# Patient Record
Sex: Female | Born: 1990 | Race: White | Hispanic: No | Marital: Married | Smoking: Never smoker
Health system: Southern US, Community
[De-identification: ages and names within clinical notes are randomized; demographics above are authoritative.]

## PROBLEM LIST (undated history)

## (undated) DIAGNOSIS — F419 Anxiety disorder, unspecified: Secondary | ICD-10-CM

## (undated) DIAGNOSIS — I10 Essential (primary) hypertension: Secondary | ICD-10-CM

## (undated) HISTORY — PX: TONSILLECTOMY: SUR1361

---

## 2015-10-20 ENCOUNTER — Inpatient Hospital Stay (HOSPITAL_COMMUNITY): Payer: 59

## 2015-10-20 ENCOUNTER — Inpatient Hospital Stay (HOSPITAL_COMMUNITY)
Admission: AD | Admit: 2015-10-20 | Discharge: 2015-10-20 | Disposition: A | Discharge status: Home / Self Care | Payer: 59 | Source: Ambulatory Visit | Attending: Obstetrics & Gynecology | Admitting: Obstetrics & Gynecology

## 2015-10-20 ENCOUNTER — Encounter (HOSPITAL_COMMUNITY): Payer: Self-pay | Admitting: *Deleted

## 2015-10-20 DIAGNOSIS — R1030 Lower abdominal pain, unspecified: Secondary | ICD-10-CM | POA: Insufficient documentation

## 2015-10-20 DIAGNOSIS — O360110 Maternal care for anti-D [Rh] antibodies, first trimester, not applicable or unspecified: Secondary | ICD-10-CM | POA: Diagnosis not present

## 2015-10-20 DIAGNOSIS — O26892 Other specified pregnancy related conditions, second trimester: Secondary | ICD-10-CM | POA: Insufficient documentation

## 2015-10-20 DIAGNOSIS — O360920 Maternal care for other rhesus isoimmunization, second trimester, not applicable or unspecified: Secondary | ICD-10-CM | POA: Diagnosis not present

## 2015-10-20 DIAGNOSIS — O162 Unspecified maternal hypertension, second trimester: Secondary | ICD-10-CM | POA: Insufficient documentation

## 2015-10-20 DIAGNOSIS — Z79899 Other long term (current) drug therapy: Secondary | ICD-10-CM | POA: Insufficient documentation

## 2015-10-20 DIAGNOSIS — F419 Anxiety disorder, unspecified: Secondary | ICD-10-CM | POA: Diagnosis not present

## 2015-10-20 DIAGNOSIS — O4691 Antepartum hemorrhage, unspecified, first trimester: Secondary | ICD-10-CM | POA: Diagnosis not present

## 2015-10-20 DIAGNOSIS — O99342 Other mental disorders complicating pregnancy, second trimester: Secondary | ICD-10-CM | POA: Insufficient documentation

## 2015-10-20 DIAGNOSIS — O209 Hemorrhage in early pregnancy, unspecified: Secondary | ICD-10-CM | POA: Insufficient documentation

## 2015-10-20 HISTORY — DX: Anxiety disorder, unspecified: F41.9

## 2015-10-20 HISTORY — DX: Essential (primary) hypertension: I10

## 2015-10-20 LAB — URINALYSIS, ROUTINE W REFLEX MICROSCOPIC
BILIRUBIN URINE: NEGATIVE
Glucose, UA: NEGATIVE mg/dL
Ketones, ur: NEGATIVE mg/dL
Leukocytes, UA: NEGATIVE
NITRITE: NEGATIVE
PH: 6 (ref 5.0–8.0)
Protein, ur: NEGATIVE mg/dL

## 2015-10-20 LAB — URINE MICROSCOPIC-ADD ON

## 2015-10-20 LAB — CBC
HEMATOCRIT: 41.4 % (ref 36.0–46.0)
Hemoglobin: 14.1 g/dL (ref 12.0–15.0)
MCH: 30.3 pg (ref 26.0–34.0)
MCHC: 34.1 g/dL (ref 30.0–36.0)
MCV: 89 fL (ref 78.0–100.0)
Platelets: 291 10*3/uL (ref 150–400)
RBC: 4.65 MIL/uL (ref 3.87–5.11)
RDW: 13.5 % (ref 11.5–15.5)
WBC: 12 10*3/uL — AB (ref 4.0–10.5)

## 2015-10-20 LAB — POCT PREGNANCY, URINE: Preg Test, Ur: POSITIVE — AB

## 2015-10-20 LAB — ABO/RH: ABO/RH(D): O NEG

## 2015-10-20 LAB — HCG, QUANTITATIVE, PREGNANCY: hCG, Beta Chain, Quant, S: 2124 m[IU]/mL — ABNORMAL HIGH (ref <5)

## 2015-10-20 MED ORDER — RHO D IMMUNE GLOBULIN 1500 UNIT/2ML IJ SOSY
300.0000 ug | PREFILLED_SYRINGE | Freq: Once | INTRAMUSCULAR | Status: AC
Start: 2015-10-20 — End: 2015-10-20
  Administered 2015-10-20: 300 ug via INTRAMUSCULAR
  Filled 2015-10-20: qty 2

## 2015-10-20 NOTE — MAU Note (Signed)
Patient states she had sex last night and had some spotting afterwards.  Today the bleeding seemed worse and seemed more "like period bleeding."  Mild cramps today as well.

## 2015-10-20 NOTE — Discharge Instructions (Signed)
Rh Incompatibility Rh incompatibility is a condition that occurs during pregnancy if a woman has Rh-negative blood and her baby has Rh-positive blood. "Rh-negative" and "Rh-positive" refer to whether or not the blood has an Rh factor. An Rh factor is a specific protein found on the surface of red blood cells. If a woman has Rh factor, she is Rh-positive. If she does not have an Rh factor, she is Rh-negative. Having or not having an Rh factor does not affect the mother's general health. However, it can cause problems during pregnancy.  WHAT KIND OF PROBLEMS CAN Rh INCOMPATIBILITY CAUSE? During pregnancy, blood from the baby can cross into the mother's bloodstream, especially during delivery. If a mother is Rh-negative and the baby is Rh-positive, the mother's defense system will react to the baby's blood as if it was a foreign substance and will create proteins (antibodies). This is called sensitization. Once the mother is sensitized, her Rh antibodies will cross the placenta to the baby and attack the baby's Rh-positive blood as if it is a harmful substance.  Rh incompatibility can also happen if the Rh-negative pregnant woman is exposed to the Rh factor during a blood transfusion with Rh-positive blood.  HOW DOES THIS CONDITION AFFECT MY BABY? The Rh antibodies that attack and destroy the baby's red blood cells can lead to hemolytic disease in the baby. Hemolytic disease is when the red blood cells break down. This can cause:   Yellowing of the skin and eyes (jaundice).  The body to not have enough healthy red blood cells (anemia).   Brain damage.   Heart failure.   Death.  These antibodies usually do not cause problems during a first pregnancy. This is because the blood from the baby often times crosses into the mother's bloodstream during delivery, and the baby is born before many of the antibodies can develop. However, the antibodies stay in your body once they have formed. Because of this,  Rh incompatibility is more likely to cause problems in second or later pregnancies (if the baby is Rh-positive).  HOW IS THIS CONDITION DIAGNOSED? When a woman becomes pregnant, blood tests may be done to find out her blood type and Rh factor. If the woman is Rh-negative, she also may have another blood test called an antibody screen. The antibody screen shows whether she has Rh antibodies in her blood. If she does, it means she was exposed to Rh-positive blood before, and she is at risk for Rh incompatibility.  To find out whether the baby is developing hemolytic anemia and how serious it is, caregivers may use more advanced tests, such as ultrasonography (commonly known as ultrasound).  HOW IS Rh INCOMPATIBILITY TREATED?  Rh incompatibility is treated with a shot of medicine called Rho (D) immune globulin. This medicine keeps the woman's body from making antibodies that can cause serious problems in the baby or future babies.  Two shots will be given, one at around your seventh month of pregnancy and the other within 72 hours of your baby being born. If you are Rh-negative, you will need this medicine every time you have a baby with Rh-positive blood. If you already have antibodies in your blood, Rho (D) immune globulin will not help. Your doctor will not give you this medicine, but will watch your pregnancy closely for problems instead.  This shot may also be given to an Rh-negative woman when the risk of blood transfer between the mom and baby is high. The risk is high with:  An amniocentesis.   A miscarriage or an abortion.   An ectopic pregnancy.   Any vaginal bleeding during pregnancy.    This information is not intended to replace advice given to you by your health care provider. Make sure you discuss any questions you have with your health care provider.   Document Released: 08/24/2001 Document Revised: 03/09/2013 Document Reviewed: 06/16/2012 Elsevier Interactive Patient  Education 2016 Elsevier Inc.  Pelvic Rest Pelvic rest is sometimes recommended for women when:   The placenta is partially or completely covering the opening of the cervix (placenta previa).  There is bleeding between the uterine wall and the amniotic sac in the first trimester (subchorionic hemorrhage).  The cervix begins to open without labor starting (incompetent cervix, cervical insufficiency).  The labor is too early (preterm labor). HOME CARE INSTRUCTIONS  Do not have sexual intercourse, stimulation, or an orgasm.  Do not use tampons, douche, or put anything in the vagina.  Do not lift anything over 10 pounds (4.5 kg).  Avoid strenuous activity or straining your pelvic muscles. SEEK MEDICAL CARE IF:  You have any vaginal bleeding during pregnancy. Treat this as a potential emergency.  You have cramping pain felt low in the stomach (stronger than menstrual cramps).  You notice vaginal discharge (watery, mucus, or bloody).  You have a low, dull backache.  There are regular contractions or uterine tightening. SEEK IMMEDIATE MEDICAL CARE IF: You have vaginal bleeding and have placenta previa.    This information is not intended to replace advice given to you by your health care provider. Make sure you discuss any questions you have with your health care provider.   Document Released: 06/29/2010 Document Revised: 05/27/2011 Document Reviewed: 09/05/2014 Elsevier Interactive Patient Education [employer]

## 2015-10-20 NOTE — MAU Provider Note (Signed)
History     CSN: [id number]  Arrival date and time: 10/20/15 1335   First Provider Initiated Contact with Patient 10/20/15 1408      Chief Complaint  Patient presents with  . Vaginal Bleeding   HPI   Ms.Savannah Cabrera is a 25 y.o. female G1P0 here in MAU today with concerns of vaginal bleeding that started yesterday evening after intercourse. She used the bathroom and wiped and initially saw a small amount of blood. This morning she woke up and wiped and noted bright red blood which was more than the initial. The bleeding has since stopped.   + mild cramping in her lower abdomen. She rates her pain 1/10. She has not taken anything for the pain.   OB History    Gravida Para Term Preterm AB Living   1             SAB TAB Ectopic Multiple Live Births                  Past Medical History:  Diagnosis Date  . Anxiety   . Hypertension     Past Surgical History:  Procedure Laterality Date  . TONSILLECTOMY      History reviewed. No pertinent family history.  Social History  Substance Use Topics  . Smoking status: Never Smoker  . Smokeless tobacco: Never Used  . Alcohol use No    Allergies:  Allergies  Allergen Reactions  . Azithromycin Hives  . Cinnamon Hives    Itchy throat, headache    Prescriptions Prior to Admission  Medication Sig Dispense Refill Last Dose  . acetaminophen (TYLENOL) 500 MG tablet Take 1,000 mg by mouth daily as needed for headache.   Past Week at Unknown time  . Prenatal Vit-Fe Fumarate-FA (PRENATAL MULTIVITAMIN) TABS tablet Take 1 tablet by mouth daily.   10/20/2015 at Unknown time   Results for orders placed or performed during the hospital encounter of 10/20/15 (from the past 48 hour(s))  Urinalysis, Routine w reflex microscopic (not at [facility])     Status: Abnormal   Collection Time: 10/20/15  1:40 PM  Result Value Ref Range   Color, Urine YELLOW YELLOW   APPearance CLEAR CLEAR   Specific Gravity, Urine <1.005 (L) 1.005 - 1.030   pH 6.0  5.0 - 8.0   Glucose, UA NEGATIVE NEGATIVE mg/dL   Hgb urine dipstick LARGE (A) NEGATIVE   Bilirubin Urine NEGATIVE NEGATIVE   Ketones, ur NEGATIVE NEGATIVE mg/dL   Protein, ur NEGATIVE NEGATIVE mg/dL   Nitrite NEGATIVE NEGATIVE   Leukocytes, UA NEGATIVE NEGATIVE  Urine microscopic-add on     Status: Abnormal   Collection Time: 10/20/15  1:40 PM  Result Value Ref Range   Squamous Epithelial / LPF 0-5 (A) NONE SEEN   WBC, UA 0-5 0 - 5 WBC/hpf   RBC / HPF 0-5 0 - 5 RBC/hpf   Bacteria, UA RARE (A) NONE SEEN  Pregnancy, urine POC     Status: Abnormal   Collection Time: 10/20/15  1:46 PM  Result Value Ref Range   Preg Test, Ur POSITIVE (A) NEGATIVE    Comment:        THE SENSITIVITY OF THIS METHODOLOGY IS >24 mIU/mL   ABO/Rh     Status: None (Preliminary result)   Collection Time: 10/20/15  2:41 PM  Result Value Ref Range   ABO/RH(D) O NEG   CBC     Status: Abnormal   Collection Time: 10/20/15  2:42 PM  Result Value Ref Range   WBC 12.0 (H) 4.0 - 10.5 K/uL   RBC 4.65 3.87 - 5.11 MIL/uL   Hemoglobin 14.1 12.0 - 15.0 g/dL   HCT 40.9 81.1 - 91.4 %   MCV 89.0 78.0 - 100.0 fL   MCH 30.3 26.0 - 34.0 pg   MCHC 34.1 30.0 - 36.0 g/dL   RDW 78.2 95.6 - 21.3 %   Platelets 291 150 - 400 K/uL  hCG, quantitative, pregnancy     Status: Abnormal   Collection Time: 10/20/15  2:42 PM  Result Value Ref Range   hCG, Beta Chain, Quant, S 2,124 (H) <5 mIU/mL    Comment:          GEST. AGE      CONC.  (mIU/mL)   <=1 WEEK        5 - 50     2 WEEKS       50 - 500     3 WEEKS       100 - 10,000     4 WEEKS     1,000 - 30,000     5 WEEKS     3,500 - 115,000   6-8 WEEKS     12,000 - 270,000    12 WEEKS     15,000 - 220,000        FEMALE AND NON-PREGNANT FEMALE:     LESS THAN 5 mIU/mL     US Ob Comp Less 14 Wks  Result Date: 10/20/2015 CLINICAL DATA:  Bleeding.  Pregnant patient. EXAM: OBSTETRIC <14 WK [place] AND TRANSVAGINAL OB US TECHNIQUE: Both transabdominal and transvaginal ultrasound  examinations were performed for complete evaluation of the gestation as well as the maternal uterus, adnexal regions, and pelvic cul-de-sac. Transvaginal technique was performed to assess early pregnancy. COMPARISON:  None. FINDINGS: Intrauterine gestational sac: A single intrauterine gestational sac is identified. Yolk sac:  Present Embryo:  A single embryo is identified Cardiac Activity: Present Heart Rate: 129 beats per minute CRL:  6  mm   6 w   3 d                  [place] EDC: June 11, 2016 Subchorionic hemorrhage:  None visualized. Maternal uterus/adnexae: The ovaries are unremarkable. The left ovary was only seen transabdominally. IMPRESSION: 1. Single live IUP.  No cause for bleeding identified. Electronically Signed   By: Gerome Sam III M.D   On: 10/20/2015 15:30   US Ob Transvaginal  Result Date: 10/20/2015 CLINICAL DATA:  Bleeding.  Pregnant patient. EXAM: OBSTETRIC <14 WK [place] AND TRANSVAGINAL OB US TECHNIQUE: Both transabdominal and transvaginal ultrasound examinations were performed for complete evaluation of the gestation as well as the maternal uterus, adnexal regions, and pelvic cul-de-sac. Transvaginal technique was performed to assess early pregnancy. COMPARISON:  None. FINDINGS: Intrauterine gestational sac: A single intrauterine gestational sac is identified. Yolk sac:  Present Embryo:  A single embryo is identified Cardiac Activity: Present Heart Rate: 129 beats per minute CRL:  6  mm   6 w   3 d                  [place] EDC: June 11, 2016 Subchorionic hemorrhage:  None visualized. Maternal uterus/adnexae: The ovaries are unremarkable. The left ovary was only seen transabdominally. IMPRESSION: 1. Single live IUP.  No cause for bleeding identified. Electronically Signed   By: Gerome Sam III M.D   On: 10/20/2015 15:30    Review  of Systems  Constitutional: Negative for chills and fever.  Gastrointestinal: Positive for nausea.   Physical Exam   Blood pressure (!) 110/54, pulse 112,  temperature 98.2 F (36.8 C), temperature source Oral, resp. rate 18, last menstrual period 08/30/2015, SpO2 100 %.  Physical Exam  Constitutional: She is oriented to person, place, and time. She appears well-developed and well-nourished. No distress.  HENT:  Head: Normocephalic.  Eyes: Pupils are equal, round, and reactive to light.  Respiratory: Effort normal.  GI: Soft. She exhibits no distension. There is no tenderness. There is no rebound.  Genitourinary:  Genitourinary Comments: Speculum exam: Vagina - Small amount of dark red blood noted in the vaginal canal, several tiny clots noted,  no odor Cervix - No contact bleeding Bimanual exam: Cervix closed Uterus non tender, slightly enlarged  Adnexa non tender, no masses bilaterally Chaperone present for exam.  Musculoskeletal: Normal range of motion.  Neurological: She is alert and oriented to person, place, and time.  Skin: Skin is warm. She is not diaphoretic.  Psychiatric: Her behavior is normal.    MAU Course  Procedures  None  MDM  UA CBC  ABO HCG [place]  O negative blood type Rhogam given Discussed patient with Dr. Langston Masker.   Assessment and Plan   A:  1. Vaginal bleeding in pregnancy, first trimester   2. Rh negative state in antepartum period, first trimester, not applicable or unspecified fetus     P:  Discharge home in stable condition Pelvic rest Return to MAU if symptoms worsen [place] picture provided Rhogam given IM Follow up with OB as scheduled  Duane Lope, NP 10/20/2015 4:12 PM

## 2015-10-21 LAB — RH IG WORKUP (INCLUDES ABO/RH)
ABO/RH(D): O NEG
Antibody Screen: NEGATIVE
GESTATIONAL AGE(WKS): 7
Unit division: 0

## 2015-10-21 LAB — HIV ANTIBODY (ROUTINE TESTING W REFLEX): HIV SCREEN 4TH GENERATION: NONREACTIVE

## 2015-10-22 ENCOUNTER — Inpatient Hospital Stay (HOSPITAL_COMMUNITY)
Admission: AD | Admit: 2015-10-22 | Discharge: 2015-10-22 | Disposition: A | Discharge status: Home / Self Care | Payer: 59 | Source: Ambulatory Visit | Attending: Obstetrics & Gynecology | Admitting: Obstetrics & Gynecology

## 2015-10-22 ENCOUNTER — Encounter (HOSPITAL_COMMUNITY): Payer: Self-pay | Admitting: *Deleted

## 2015-10-22 ENCOUNTER — Inpatient Hospital Stay (HOSPITAL_COMMUNITY): Payer: 59

## 2015-10-22 DIAGNOSIS — N939 Abnormal uterine and vaginal bleeding, unspecified: Secondary | ICD-10-CM | POA: Diagnosis present

## 2015-10-22 DIAGNOSIS — O021 Missed abortion: Secondary | ICD-10-CM | POA: Insufficient documentation

## 2015-10-22 DIAGNOSIS — O034 Incomplete spontaneous abortion without complication: Secondary | ICD-10-CM

## 2015-10-22 MED ORDER — OXYCODONE-ACETAMINOPHEN 5-325 MG PO TABS: 2.0000 | ORAL_TABLET | ORAL | 0 refills | Status: DC | PRN

## 2015-10-22 MED ORDER — IBUPROFEN 600 MG PO TABS: 600.0000 mg | ORAL_TABLET | Freq: Four times a day (QID) | ORAL | 1 refills | Status: DC | PRN

## 2015-10-22 NOTE — MAU Provider Note (Signed)
History     CSN: [id number]  Arrival date and time: 10/22/15 1742   First Provider Initiated Contact with Patient 10/22/15 1804      Chief Complaint  Patient presents with  . Vaginal Bleeding   Savannah Cabrera is a 25 y.o. G1P0 at [redacted]w[redacted]d evaluated here 2 days ago for light vaginal bleeding and now presenting with increase in amount of bleeding similar to light menses with clots and questionable passage of gray tissue. She continues to have lower abdominal cramping. O neg, received Rhogam [place] 10/20/2015: viable IUP HR 129, no SCH Quantitative beta hCG 10/20/2015: 2124       Past Medical History:  Diagnosis Date  . Anxiety   . Hypertension     Past Surgical History:  Procedure Laterality Date  . TONSILLECTOMY      History reviewed. No pertinent family history.  Social History  Substance Use Topics  . Smoking status: Never Smoker  . Smokeless tobacco: Never Used  . Alcohol use No    Allergies:  Allergies  Allergen Reactions  . Azithromycin Hives  . Cinnamon Hives    Itchy throat, headache    Prescriptions Prior to Admission  Medication Sig Dispense Refill Last Dose  . acetaminophen (TYLENOL) 500 MG tablet Take 1,000 mg by mouth daily as needed for headache.   Past Week at Unknown time  . Prenatal Vit-Fe Fumarate-FA (PRENATAL MULTIVITAMIN) TABS tablet Take 1 tablet by mouth daily.   10/20/2015 at Unknown time    Review of Systems  Constitutional: Negative for chills and fever.  Neurological: Negative for headaches.  Psychiatric/Behavioral: Negative for depression.   Physical Exam   Blood pressure 116/77, pulse 113, temperature 98.3 F (36.8 C), temperature source Oral, resp. rate 16, last menstrual period 08/30/2015.  Physical Exam  Nursing note and vitals reviewed. Constitutional: She is oriented to person, place, and time.  Obese  HENT:  Head: Normocephalic.  Eyes: Pupils are equal, round, and reactive to light.  Neck: Normal range of motion.   Cardiovascular: Normal rate.   Respiratory: Effort normal.  GI: Soft. There is no tenderness.  Genitourinary:  Genitourinary Comments: Peri pad with small amount pink blood SVE: cx long, closed. Scant BRB present, no clots or tissue  Musculoskeletal: Normal range of motion.  Neurological: She is alert and oriented to person, place, and time.  Skin: Skin is warm and dry.  Psychiatric: She has a normal mood and affect. Her behavior is normal.    MAU Course  Procedures US Ob Transvaginal  Result Date: 10/22/2015 CLINICAL DATA:  Pregnant patient with vaginal bleeding. EXAM: TRANSVAGINAL OB ULTRASOUND TECHNIQUE: Transvaginal ultrasound was performed for complete evaluation of the gestation as well as the maternal uterus, adnexal regions, and pelvic cul-de-sac. COMPARISON:  Pelvic ultrasound 10/20/2015 FINDINGS: Intrauterine gestational sac: Single Yolk sac:  Present Embryo:  Present Cardiac Activity: Not present Heart Rate: 0 bpm CRL:   6.5  mm   6 w 4 d Subchorionic hemorrhage:  None visualized. Maternal uterus/adnexae: Normal right ovary. Left ovary not visualized. No free fluid in the pelvis. IMPRESSION: Intrauterine gestation with crown-rump length of 6.5 mm. No cardiac activity identified on current exam. Findings are suspicious but not yet definitive for failed pregnancy. Recommend follow-up US in 10-14 days for definitive diagnosis. This recommendation follows SRU consensus guidelines: Diagnostic Criteria for Nonviable Pregnancy Early in the First Trimester. Savannah Cabrera 2013; 409:8119-14. Electronically Signed   By: Savannah Belt M.D.   On: 10/22/2015 19:07  Discussed options with patient. Offered cytotec or expectant management. She elects to keep appointment with Savannah Cabrera in 2 days. C/W Savannah Cabrera> Patient to call office tomorrow morning to be seen tomorrow if possible    Assessment and Plan  G1 at 6033w5d IUP 1. Embryonic demise   2. Incomplete spontaneous abortion      Medication  List    TAKE these medications   acetaminophen 500 MG tablet Commonly known as:  TYLENOL Take 1,000 mg by mouth daily as needed for headache.   ibuprofen 600 MG tablet Commonly known as:  ADVIL,MOTRIN Take 1 tablet (600 mg total) by mouth every 6 (six) hours as needed (Use if ibuprofen ineffective).   oxyCODONE-acetaminophen 5-325 MG tablet Commonly known as:  PERCOCET/ROXICET Take 2 tablets by mouth every 4 (four) hours as needed for severe pain.   prenatal multivitamin Tabs tablet Take 1 tablet by mouth daily.      Follow-up Information    Savannah Cabrera,Savannah Cabrera, Savannah Cabrera. Schedule an appointment as soon as possible for a visit today.   Specialty:  Obstetrics and Gynecology Contact information: [address] [place] [id number] [phone]           Savannah Cabrera 10/22/2015, 6:18 PM

## 2015-10-22 NOTE — MAU Note (Signed)
Pt was here two days ago with vaginal bleeding following intercourse.  Pt was instructed to return if bleeding got worse or if she had new onset cramping.  The triage nurse also asked her if she had any gray-ish discharge and she said she had, so they instructed her to come in right away.  She is still bleeding, mostly only noticeable when she wipes but it is bright red and she has passed several small clots with new onset cramping that is intermittent ranging from a 2/10-5/10 on the pain scale. She has a [facility] scheduled for [facility].

## 2015-10-22 NOTE — Discharge Instructions (Signed)
Incomplete Miscarriage A miscarriage is the sudden loss of an unborn baby (fetus) before the 20th week of pregnancy. In an incomplete miscarriage, parts of the fetus or placenta (afterbirth) remain in the body.  Having a miscarriage can be an emotional experience. Talk with your health care provider about any questions you may have about miscarrying, the grieving process, and your future pregnancy plans. CAUSES   Problems with the fetal chromosomes that make it impossible for the baby to develop normally. Problems with the baby's genes or chromosomes are most often the result of errors that occur by chance as the embryo divides and grows. The problems are not inherited from the parents.  Infection of the cervix or uterus.  Hormone problems.  Problems with the cervix, such as having an incompetent cervix. This is when the tissue in the cervix is not strong enough to hold the pregnancy.  Problems with the uterus, such as an abnormally shaped uterus, uterine fibroids, or congenital abnormalities.  Certain medical conditions.  Smoking, drinking alcohol, or taking illegal drugs.  Trauma. SYMPTOMS   Vaginal bleeding or spotting, with or without cramps or pain.  Pain or cramping in the abdomen or lower back.  Passing fluid, tissue, or blood clots from the vagina. DIAGNOSIS  Your health care provider will perform a physical exam. You may also have an ultrasound to confirm the miscarriage. Blood or urine tests may also be ordered. TREATMENT   Usually, a dilation and curettage (D&C) procedure is performed. During a D&C procedure, the cervix is widened (dilated) and any remaining fetal or placental tissue is gently removed from the uterus.  Antibiotic medicines are prescribed if there is an infection. Other medicines may be given to reduce the size of the uterus (contract) if there is a lot of bleeding.  If you have Rh negative blood and your baby was Rh positive, you will need a Rho (D)  immune globulin shot. This shot will protect any future baby from having Rh blood problems in future pregnancies.  You may be confined to bed rest. This means you should stay in bed and only get up to use the bathroom. HOME CARE INSTRUCTIONS   Rest as directed by your health care provider.  Restrict activity as directed by your health care provider. You may be allowed to continue light activity if curettage was not done but you require further treatment.  Keep track of the number of pads you use each day. Keep track of how soaked (saturated) they are. Record this information.  Do not  use tampons.  Do not douche or have sexual intercourse until approved by your health care provider.  Keep all follow-up appointments for reevaluation and continuing management.  Only take over-the-counter or prescription medicines for pain, fever, or discomfort as directed by your health care provider.  Take antibiotic medicine as directed by your health care provider. Make sure you finish it even if you start to feel better. SEEK IMMEDIATE MEDICAL CARE IF:   You experience severe cramps in your stomach, back, or abdomen.  You have an unexplained temperature (make sure to record these temperatures).  You pass large clots or tissue (save these for your health care provider to inspect).  Your bleeding increases.  You become light-headed, weak, or have fainting episodes. MAKE SURE YOU:   Understand these instructions.  Will watch your condition.  Will get help right away if you are not doing well or get worse.   This information is not intended to   replace advice given to you by your health care provider. Make sure you discuss any questions you have with your health care provider.   Document Released: 03/04/2005 Document Revised: 03/25/2014 Document Reviewed: 10/01/2012 Elsevier Interactive Patient Education 2016 [employer] Inc.  

## 2016-08-20 LAB — OB RESULTS CONSOLE HIV ANTIBODY (ROUTINE TESTING): HIV: NONREACTIVE

## 2016-08-20 LAB — OB RESULTS CONSOLE GC/CHLAMYDIA
Chlamydia: NEGATIVE
Gonorrhea: NEGATIVE

## 2016-08-20 LAB — OB RESULTS CONSOLE RUBELLA ANTIBODY, IGM: Rubella: NON-IMMUNE/NOT IMMUNE

## 2016-08-20 LAB — OB RESULTS CONSOLE RPR: RPR: NONREACTIVE

## 2016-08-20 LAB — OB RESULTS CONSOLE HEPATITIS B SURFACE ANTIGEN: Hepatitis B Surface Ag: NEGATIVE

## 2016-08-24 ENCOUNTER — Encounter (HOSPITAL_COMMUNITY): Payer: Self-pay

## 2017-02-20 LAB — OB RESULTS CONSOLE GBS: STREP GROUP B AG: NEGATIVE

## 2017-03-19 ENCOUNTER — Encounter (HOSPITAL_COMMUNITY): Payer: Self-pay | Admitting: *Deleted

## 2017-03-19 ENCOUNTER — Inpatient Hospital Stay (HOSPITAL_COMMUNITY): Payer: 59 | Admitting: Anesthesiology

## 2017-03-19 ENCOUNTER — Inpatient Hospital Stay (HOSPITAL_COMMUNITY)
Admission: AD | Admit: 2017-03-19 | Discharge: 2017-03-21 | DRG: 788 | Disposition: A | Discharge status: Home / Self Care | Payer: 59 | Source: Ambulatory Visit | Attending: Obstetrics and Gynecology | Admitting: Obstetrics and Gynecology

## 2017-03-19 ENCOUNTER — Encounter (HOSPITAL_COMMUNITY)
Admission: AD | Disposition: A | Discharge status: Home / Self Care | Payer: Self-pay | Source: Ambulatory Visit | Attending: Obstetrics and Gynecology

## 2017-03-19 ENCOUNTER — Other Ambulatory Visit: Payer: Self-pay

## 2017-03-19 DIAGNOSIS — O99214 Obesity complicating childbirth: Secondary | ICD-10-CM | POA: Diagnosis present

## 2017-03-19 DIAGNOSIS — K219 Gastro-esophageal reflux disease without esophagitis: Secondary | ICD-10-CM | POA: Diagnosis present

## 2017-03-19 DIAGNOSIS — O9962 Diseases of the digestive system complicating childbirth: Secondary | ICD-10-CM | POA: Diagnosis present

## 2017-03-19 DIAGNOSIS — Z3483 Encounter for supervision of other normal pregnancy, third trimester: Secondary | ICD-10-CM | POA: Diagnosis present

## 2017-03-19 DIAGNOSIS — Z3A39 39 weeks gestation of pregnancy: Secondary | ICD-10-CM

## 2017-03-19 LAB — CBC
HCT: 43.1 % (ref 36.0–46.0)
Hemoglobin: 14.1 g/dL (ref 12.0–15.0)
MCH: 28.8 pg (ref 26.0–34.0)
MCHC: 32.7 g/dL (ref 30.0–36.0)
MCV: 88.1 fL (ref 78.0–100.0)
PLATELETS: 182 10*3/uL (ref 150–400)
RBC: 4.89 MIL/uL (ref 3.87–5.11)
RDW: 14.7 % (ref 11.5–15.5)
WBC: 12.3 10*3/uL — AB (ref 4.0–10.5)

## 2017-03-19 LAB — TYPE AND SCREEN
ABO/RH(D): O NEG
ANTIBODY SCREEN: NEGATIVE

## 2017-03-19 LAB — POCT FERN TEST: POCT FERN TEST: NEGATIVE

## 2017-03-19 SURGERY — Surgical Case
Anesthesia: Spinal

## 2017-03-19 MED ORDER — OXYTOCIN 40 UNITS IN LACTATED RINGERS INFUSION - SIMPLE MED: 2.5000 [IU]/h | INTRAVENOUS | Status: DC

## 2017-03-19 MED ORDER — MORPHINE SULFATE (PF) 0.5 MG/ML IJ SOLN
INTRAMUSCULAR | Status: AC
  Filled 2017-03-19: qty 10

## 2017-03-19 MED ORDER — DEXTROSE 5 % IV SOLN
INTRAVENOUS | Status: AC
  Filled 2017-03-19: qty 3000

## 2017-03-19 MED ORDER — MEPERIDINE HCL 25 MG/ML IJ SOLN
INTRAMUSCULAR | Status: AC
  Filled 2017-03-19: qty 1

## 2017-03-19 MED ORDER — DEXAMETHASONE SODIUM PHOSPHATE 10 MG/ML IJ SOLN
INTRAMUSCULAR | Status: AC
  Filled 2017-03-19: qty 1

## 2017-03-19 MED ORDER — LACTATED RINGERS IV SOLN
INTRAVENOUS | Status: DC
  Administered 2017-03-19 (×4): via INTRAVENOUS

## 2017-03-19 MED ORDER — MORPHINE SULFATE (PF) 0.5 MG/ML IJ SOLN
INTRAMUSCULAR | Status: DC | PRN
  Administered 2017-03-19: .2 mg via INTRATHECAL

## 2017-03-19 MED ORDER — FENTANYL CITRATE (PF) 100 MCG/2ML IJ SOLN
INTRAMUSCULAR | Status: AC
  Filled 2017-03-19: qty 2

## 2017-03-19 MED ORDER — DEXTROSE 5 % IV SOLN
3.0000 g | INTRAVENOUS | Status: DC
  Filled 2017-03-19: qty 3000

## 2017-03-19 MED ORDER — ONDANSETRON HCL 4 MG/2ML IJ SOLN
4.0000 mg | Freq: Four times a day (QID) | INTRAMUSCULAR | Status: DC | PRN
  Administered 2017-03-19: 4 mg via INTRAVENOUS
  Filled 2017-03-19: qty 2

## 2017-03-19 MED ORDER — OXYCODONE-ACETAMINOPHEN 5-325 MG PO TABS: 1.0000 | ORAL_TABLET | ORAL | Status: DC | PRN

## 2017-03-19 MED ORDER — TERBUTALINE SULFATE 1 MG/ML IJ SOLN: 0.2500 mg | Freq: Once | INTRAMUSCULAR | Status: DC | PRN

## 2017-03-19 MED ORDER — PHENYLEPHRINE HCL 10 MG/ML IJ SOLN
INTRAMUSCULAR | Status: DC | PRN
  Administered 2017-03-19 (×6): 80 ug via INTRAVENOUS

## 2017-03-19 MED ORDER — SOD CITRATE-CITRIC ACID 500-334 MG/5ML PO SOLN
30.0000 mL | ORAL | Status: DC | PRN
  Administered 2017-03-19 (×2): 30 mL via ORAL
  Filled 2017-03-19 (×2): qty 15

## 2017-03-19 MED ORDER — OXYCODONE-ACETAMINOPHEN 5-325 MG PO TABS: 2.0000 | ORAL_TABLET | ORAL | Status: DC | PRN

## 2017-03-19 MED ORDER — FLEET ENEMA 7-19 GM/118ML RE ENEM: 1.0000 | ENEMA | RECTAL | Status: DC | PRN

## 2017-03-19 MED ORDER — ONDANSETRON HCL 4 MG/2ML IJ SOLN
INTRAMUSCULAR | Status: DC | PRN
  Administered 2017-03-19: 4 mg via INTRAVENOUS

## 2017-03-19 MED ORDER — OXYTOCIN 40 UNITS IN LACTATED RINGERS INFUSION - SIMPLE MED
1.0000 m[IU]/min | INTRAVENOUS | Status: DC
  Administered 2017-03-19: 2 m[IU]/min via INTRAVENOUS
  Filled 2017-03-19: qty 1000

## 2017-03-19 MED ORDER — FENTANYL CITRATE (PF) 100 MCG/2ML IJ SOLN
INTRAMUSCULAR | Status: DC | PRN
  Administered 2017-03-19: 10 ug via INTRATHECAL

## 2017-03-19 MED ORDER — DEXTROSE 5 % IV SOLN
INTRAVENOUS | Status: DC | PRN
  Administered 2017-03-19: 3 g via INTRAVENOUS

## 2017-03-19 MED ORDER — SCOPOLAMINE 1 MG/3DAYS TD PT72
MEDICATED_PATCH | TRANSDERMAL | Status: DC | PRN
  Administered 2017-03-19: 1 via TRANSDERMAL

## 2017-03-19 MED ORDER — MEPERIDINE HCL 25 MG/ML IJ SOLN
INTRAMUSCULAR | Status: DC | PRN
  Administered 2017-03-19 (×2): 12.5 mg via INTRAVENOUS

## 2017-03-19 MED ORDER — ACETAMINOPHEN 325 MG PO TABS: 650.0000 mg | ORAL_TABLET | ORAL | Status: DC | PRN

## 2017-03-19 MED ORDER — OXYTOCIN BOLUS FROM INFUSION: 500.0000 mL | Freq: Once | INTRAVENOUS | Status: DC

## 2017-03-19 MED ORDER — FENTANYL CITRATE (PF) 100 MCG/2ML IJ SOLN
25.0000 ug | INTRAMUSCULAR | Status: DC | PRN
  Administered 2017-03-19: 50 ug via INTRAVENOUS

## 2017-03-19 MED ORDER — SCOPOLAMINE 1 MG/3DAYS TD PT72
MEDICATED_PATCH | TRANSDERMAL | Status: AC
  Filled 2017-03-19: qty 1

## 2017-03-19 MED ORDER — LACTATED RINGERS IV SOLN: 500.0000 mL | INTRAVENOUS | Status: DC | PRN

## 2017-03-19 MED ORDER — DEXAMETHASONE SODIUM PHOSPHATE 10 MG/ML IJ SOLN
INTRAMUSCULAR | Status: DC | PRN
  Administered 2017-03-19: 10 mg via INTRAVENOUS

## 2017-03-19 MED ORDER — LIDOCAINE HCL (PF) 1 % IJ SOLN: 30.0000 mL | INTRAMUSCULAR | Status: DC | PRN

## 2017-03-19 MED ORDER — PHENYLEPHRINE 8 MG IN D5W 100 ML (0.08MG/ML) PREMIX OPTIME
INJECTION | INTRAVENOUS | Status: DC | PRN
  Administered 2017-03-19: 40 ug/min via INTRAVENOUS

## 2017-03-19 MED ORDER — MEPERIDINE HCL 25 MG/ML IJ SOLN: 6.2500 mg | INTRAMUSCULAR | Status: DC | PRN

## 2017-03-19 MED ORDER — ONDANSETRON HCL 4 MG/2ML IJ SOLN
INTRAMUSCULAR | Status: AC
  Filled 2017-03-19: qty 2

## 2017-03-19 MED ORDER — PROMETHAZINE HCL 25 MG/ML IJ SOLN
6.2500 mg | INTRAMUSCULAR | Status: DC | PRN
  Administered 2017-03-20: 12.5 mg via INTRAVENOUS

## 2017-03-19 MED ORDER — OXYTOCIN 10 UNIT/ML IJ SOLN
INTRAVENOUS | Status: DC | PRN
  Administered 2017-03-19: 40 [IU] via INTRAVENOUS

## 2017-03-19 MED ORDER — OXYTOCIN 10 UNIT/ML IJ SOLN
INTRAMUSCULAR | Status: AC
  Filled 2017-03-19: qty 4

## 2017-03-19 MED ORDER — SOD CITRATE-CITRIC ACID 500-334 MG/5ML PO SOLN: 30.0000 mL | ORAL | Status: DC

## 2017-03-19 MED ORDER — BUPIVACAINE IN DEXTROSE 0.75-8.25 % IT SOLN
INTRATHECAL | Status: DC | PRN
  Administered 2017-03-19: 1.6 mL via INTRATHECAL

## 2017-03-19 SURGICAL SUPPLY — 41 items
BENZOIN TINCTURE PRP APPL 2/3 (GAUZE/BANDAGES/DRESSINGS) ×3 IMPLANT
CHLORAPREP W/TINT 26ML (MISCELLANEOUS) ×3 IMPLANT
CLAMP CORD UMBIL (MISCELLANEOUS) IMPLANT
CLOSURE STERI STRIP 1/2 X4 (GAUZE/BANDAGES/DRESSINGS) ×3 IMPLANT
CLOTH BEACON ORANGE TIMEOUT ST (SAFETY) ×3 IMPLANT
DERMABOND ADVANCED (GAUZE/BANDAGES/DRESSINGS) ×2
DERMABOND ADVANCED .7 DNX12 (GAUZE/BANDAGES/DRESSINGS) ×1 IMPLANT
DRSG OPSITE POSTOP 4X10 (GAUZE/BANDAGES/DRESSINGS) ×6 IMPLANT
ELECT REM PT RETURN 9FT ADLT (ELECTROSURGICAL) ×3
ELECTRODE REM PT RTRN 9FT ADLT (ELECTROSURGICAL) ×1 IMPLANT
EXTRACTOR VACUUM KIWI (MISCELLANEOUS) ×3 IMPLANT
GLOVE BIO SURGEON STRL SZ 6.5 (GLOVE) ×2 IMPLANT
GLOVE BIO SURGEONS STRL SZ 6.5 (GLOVE) ×1
GLOVE BIOGEL PI IND STRL 6.5 (GLOVE) ×1 IMPLANT
GLOVE BIOGEL PI IND STRL 7.0 (GLOVE) ×2 IMPLANT
GLOVE BIOGEL PI INDICATOR 6.5 (GLOVE) ×2
GLOVE BIOGEL PI INDICATOR 7.0 (GLOVE) ×4
GOWN STRL REUS W/TWL LRG LVL3 (GOWN DISPOSABLE) ×6 IMPLANT
KIT ABG SYR 3ML LUER SLIP (SYRINGE) ×3 IMPLANT
NEEDLE HYPO 25X5/8 SAFETYGLIDE (NEEDLE) ×3 IMPLANT
NS IRRIG 1000ML POUR BTL (IV SOLUTION) ×3 IMPLANT
PACK C SECTION WH (CUSTOM PROCEDURE TRAY) ×3 IMPLANT
PAD ABD 7.5X8 STRL (GAUZE/BANDAGES/DRESSINGS) ×3 IMPLANT
PAD OB MATERNITY 4.3X12.25 (PERSONAL CARE ITEMS) ×3 IMPLANT
PENCIL SMOKE EVAC W/HOLSTER (ELECTROSURGICAL) ×3 IMPLANT
RETRACTOR WND ALEXIS 25 LRG (MISCELLANEOUS) IMPLANT
RTRCTR WOUND ALEXIS 25CM LRG (MISCELLANEOUS)
SPONGE GAUZE 4X4 12PLY STER LF (GAUZE/BANDAGES/DRESSINGS) ×6 IMPLANT
SPONGE LAP 18X18 X RAY DECT (DISPOSABLE) ×3 IMPLANT
STRIP CLOSURE SKIN 1/2X4 (GAUZE/BANDAGES/DRESSINGS) ×2 IMPLANT
SUT PLAIN 0 NONE (SUTURE) IMPLANT
SUT PLAIN 2 0 (SUTURE) ×2
SUT PLAIN ABS 2-0 CT1 27XMFL (SUTURE) ×1 IMPLANT
SUT VIC AB 0 CT1 36 (SUTURE) ×3 IMPLANT
SUT VIC AB 0 CTX 36 (SUTURE) ×4
SUT VIC AB 0 CTX36XBRD ANBCTRL (SUTURE) ×2 IMPLANT
SUT VIC AB 2-0 SH 27 (SUTURE) ×2
SUT VIC AB 2-0 SH 27XBRD (SUTURE) ×1 IMPLANT
SUT VIC AB 4-0 PS2 27 (SUTURE) ×3 IMPLANT
TOWEL OR 17X24 6PK STRL BLUE (TOWEL DISPOSABLE) ×3 IMPLANT
TRAY FOLEY BAG SILVER LF 14FR (SET/KITS/TRAYS/PACK) IMPLANT

## 2017-03-19 NOTE — Progress Notes (Signed)
SVE 6/90/-2 Recommended AROM while head is applied. Patient declines. Requesting IV be removed. Patient is vomiting and tachycardic and I recommended against removal.    Rosie FateE Sloan Takagi MD

## 2017-03-19 NOTE — Progress Notes (Signed)
SVE unchanged and contractions inadequate. D/w patient and husband now 3 hours w/out any cervical change. Recommend pitocin and they agree. Discussed we will attempt to titrate to ctxn adequacy.

## 2017-03-19 NOTE — Progress Notes (Signed)
Pt informed Dr. Elon SpannerLeger informed of her arrival & status.  POC is for pt to ambulate for 1 hour and cervix reeval upon completion of walking.  Pt instructed to ambulate, return in 1 hour or sooner if VB or ROM.  Pt verbalized understanding and agreeable.

## 2017-03-19 NOTE — MAU Note (Signed)
Urine in lab 

## 2017-03-19 NOTE — MAU Note (Signed)
Pt presents with c/o regular ctxs since 0330 this morning & LOF since 0430.  Denies VB.  Reports +FM.

## 2017-03-19 NOTE — Anesthesia Pain Management Evaluation Note (Signed)
  CRNA Pain Management Visit Note  Patient: Savannah Cabrera, 27 y.o., female  "Hello I am a member of the anesthesia team at [facility]. We have an anesthesia team available at all times to provide care throughout the hospital, including epidural management and anesthesia for C-section. I don't know your plan for the delivery whether it a natural birth, water birth, IV sedation, nitrous supplementation, doula or epidural, but we want to meet your pain goals."   1.Was your pain managed to your expectations on prior hospitalizations?   No prior hospitalizations  2.What is your expectation for pain management during this hospitalization?     Doula  3.How can we help you reach that goal? Be available if needed  Record the patient's initial score and the patient's pain goal.   Pain: 0  Pain Goal: 10 [facility] wants you to be able to say your pain was always managed very well.  Savannah Cabrera 03/19/2017

## 2017-03-19 NOTE — Transfer of Care (Signed)
Immediate Anesthesia Transfer of Care Note  Patient: Savannah Cabrera  Procedure(s) Performed: CESAREAN SECTION (N/A )  Patient Location: PACU  Anesthesia Type:Spinal  Level of Consciousness: awake, alert  and oriented  Airway & Oxygen Therapy: Patient Spontanous Breathing  Post-op Assessment: Report given to RN and Post -op Vital signs reviewed and stable  Post vital signs: Reviewed and stable  Last Vitals:  Vitals:   03/19/17 2051 03/19/17 2122  BP: (!) 143/80 (!) 139/97  Pulse: (!) 139 (!) 142  Resp: (!) 21   Temp: 36.9 C     Last Pain:  Vitals:   03/19/17 2051  TempSrc: Axillary  PainSc:          Complications: No apparent anesthesia complications

## 2017-03-19 NOTE — Op Note (Signed)
PROCEDURE DATE: 03/19/17  PREOPERATIVE DIAGNOSIS: Intrauterine pregnancy at 39.3 wga, Indication: arrest of dilation  POSTOPERATIVE DIAGNOSIS:The same  PROCEDURE: Primary Low TransverseCesarean Section  SURGEON: Dr. Belva AgeeElise Daphna Lafuente  INDICATIONS:This is a 26yo G2P0 at 39.3 wga requiring cesarean section secondary to arrest of dilation. Remained 8cm x 4 hours. Decision made to proceed with pLTCS.The risks of cesarean section discussed with the patient included but were not limited to: bleeding which may require transfusion or reoperation; infection which may require antibiotics; injury to bowel, bladder, ureters or other surrounding organs; injury to the fetus; need for additional procedures including hysterectomy in the event of a life-threatening hemorrhage; placental abnormalities wth subsequent pregnancies, incisional problems, thromboembolic phenomenon and other postoperative/anesthesia complications. The patient agreed with the proposed plan, giving informed consent for the procedure.   FINDINGS: Viable femaleinfant in vertex presentation,APGARs pending, Weight (prelim) 11 pounds, 2 ounces, Amniotic fluid clear, Intact placenta, three vessel cord. Grossly normal uterus, ovaries and fallopian tubes. .  ANESTHESIA: Epidural ESTIMATED BLOOD LOSS: 1056cc SPECIMENS: Placenta for pathology s/s LGA COMPLICATIONS: None immediate   PROCEDURE IN DETAIL: The patient received intravenous antibiotics (2g Ancef) and had sequential compression devices applied to her lower extremities while in the preoperative area. Shewasthen taken to the operating roomwhere epidural anesthesiawas dosed up to surgical level andwas found to be adequate. She was then placed in a dorsal supine position with a leftward tilt,and prepped and draped in a sterile manner.A foley catheter was placed into her bladder and attached to constant gravity. After an adequate timeout was performed,  aPfannenstiel skin incision was made with scalpel and carried through to the underlying layer of fascia. The fascia was incised in the midline and this incision was extended bilaterally using the Mayo scissors. Kocher clamps were applied to the superior aspect of the fascial incision and the underlying rectus muscles were dissected off bluntly. A similar process was carried out on the inferior aspect of the facial incision. The rectus muscles were separated in the midline bluntly and the peritoneum was entered bluntly. A bladder flap was created sharply and developed bluntly.Atransverse hysterotomy was made with a scalpel and extended bilaterally bluntly. The bladder blade was then removed. The infant was successfully delivered, and cord was clamped and cut and infant was handed over to awaiting neonatology team. Uterine massage was then administered and the placenta delivered intact with three-vessel cord. Cord gases were taken. The uterus was cleared of clot and debris. The hysterotomy was closed with 0 vicryl.A second imbricating suture of 0-vicryl was used to reinforce the incision and aid in hemostasis.The fascia was closed with 0-Vicryl in a running fashion with good restoration of anatomy. The subcutaneus tissue was irrigated and was reapproximated using three interrupted plain gut stitches. The skin was closed with 4-0 Vicryl in a subcuticular fashion.  Final EBL was 1056cc (all surgical site and was hemostatic at end of procedure) without any further bleeding on exam.   Pt tolerated the procedure well. All sponge/lap/needle counts were correct X 2. Pt taken to recovery room in stable condition.   Belva AgeeElise Keala Drum MD

## 2017-03-19 NOTE — Progress Notes (Signed)
AROM for clear fluid after pt consented. 8/c/-2 (but head well applied). OA Discuss high station and my concern for possible labor dystocia s/s large fetal vertex. Patient continues to desire expectant management.   Rosie FateE Naelani Lafrance MD

## 2017-03-19 NOTE — Progress Notes (Signed)
SVE completely unchanged.  IUPC and IFE placed and thus far contractions seem to be inadequate. I recommended labor augmentation and patient and husband decline pitocin. I voiced my concern regarding high station with being OA and 8cm - concern for impending labor dystocia. Patient and husband understand. CTM closely.    Rosie FateE Karey Suthers MD

## 2017-03-19 NOTE — Brief Op Note (Signed)
03/19/2017  11:02 PM  PATIENT:  Savannah Cabrera  27 y.o. female  PRE-OPERATIVE DIAGNOSIS:  Primary Cesarean section due to arrest of dilation  POST-OPERATIVE DIAGNOSIS:  Primary Cesarean section due to arrest of dilation  PROCEDURE:  Procedure(s): CESAREAN SECTION (N/A)  SURGEON:  Surgeon(s) and Role:    * Ranae PilaLeger, Deniz Eskridge Jennifer, MD - Primary  PHYSICIAN ASSISTANT:   ASSISTANTS: none   ANESTHESIA:   none  EBL:  1056 mL   BLOOD ADMINISTERED:none  DRAINS: Urinary Catheter (Foley)   LOCAL MEDICATIONS USED:  NONE  SPECIMEN:  Source of Specimen:  placenta  DISPOSITION OF SPECIMEN:  PATHOLOGY  COUNTS:  YES  TOURNIQUET:  * No tourniquets in log *  DICTATION: .Note written in EPIC  PLAN OF CARE: Admit to inpatient   PATIENT DISPOSITION:  PACU - hemodynamically stable.   Delay start of Pharmacological VTE agent (>24hrs) due to surgical blood loss or risk of bleeding: not applicable

## 2017-03-19 NOTE — Progress Notes (Signed)
Remains 8cm x >4 hours. SVE with very swollen cervix - so swollen that she feels more like 6cm than 8cm. Vertex remains -2. Contractions remain inadequate. Recommend proceed with primary CS for arrest of dilation. Risks discussed. All questions answered. Patient gives informed consent. 3g ancef on call to OR.     Rosie FateE Caydence Koenig MD

## 2017-03-19 NOTE — Anesthesia Procedure Notes (Signed)
Spinal  Patient location during procedure: OR Start time: 03/19/2017 9:41 PM End time: 03/19/2017 9:45 PM Staffing Anesthesiologist: Beryle LatheBrock, Juneau Doughman E, MD Performed: anesthesiologist  Preanesthetic Checklist Completed: patient identified, surgical consent, pre-op evaluation, timeout performed, IV checked, risks and benefits discussed and monitors and equipment checked Spinal Block Patient position: sitting Prep: DuraPrep Patient monitoring: heart rate, cardiac monitor, continuous pulse ox and blood pressure Approach: midline Location: L3-4 Injection technique: single-shot Needle Needle type: Pencan  Needle gauge: 24 G Additional Notes Functioning IV was confirmed and monitors were applied. Sterile prep and drape, including hand hygiene, mask, and sterile gloves were used. The patient was positioned and the spine was prepped. The skin was anesthetized with lidocaine. Free flow of clear CSF was obtained prior to injecting local anesthetic into the CSF. The spinal needle aspirated freely following injection. The needle was carefully withdrawn. The patient tolerated the procedure well. Consent was obtained prior to the procedure with all questions answered and concerns addressed. Risks including, but not limited to, bleeding, infection, nerve damage, paralysis, failed block, inadequate analgesia, allergic reaction, high spinal, itching, and headache were discussed and the patient wished to proceed.  Savannah Peerhomas Han Vejar, MD

## 2017-03-19 NOTE — Anesthesia Preprocedure Evaluation (Addendum)
Anesthesia Evaluation  Patient identified by MRN, date of birth, ID band Patient awake    Reviewed: Allergy & Precautions, NPO status , Patient's Chart, lab work & pertinent test results  Airway Mallampati: III  TM Distance: >3 FB Neck ROM: Full    Dental  (+) Dental Advisory Given, Teeth Intact, Implants,    Pulmonary neg pulmonary ROS,    Pulmonary exam normal breath sounds clear to auscultation       Cardiovascular Normal cardiovascular exam Rhythm:Regular Rate:Normal  Hx HTN in past, resolved per patient - no meds   Neuro/Psych Anxiety negative neurological ROS     GI/Hepatic Neg liver ROS, GERD  Controlled and Medicated,  Endo/Other  Morbid obesity  Renal/GU negative Renal ROS  negative genitourinary   Musculoskeletal negative musculoskeletal ROS (+)   Abdominal (+) + obese,   Peds  Hematology negative hematology ROS (+)   Anesthesia Other Findings   Reproductive/Obstetrics (+) Pregnancy                            Anesthesia Physical Anesthesia Plan  ASA: III  Anesthesia Plan: Spinal   Post-op Pain Management:    Induction:   PONV Risk Score and Plan: Treatment may vary due to age or medical condition  Airway Management Planned: Natural Airway and Nasal Cannula  Additional Equipment: None  Intra-op Plan:   Post-operative Plan:   Informed Consent: I have reviewed the patients History and Physical, chart, labs and discussed the procedure including the risks, benefits and alternatives for the proposed anesthesia with the patient or authorized representative who has indicated his/her understanding and acceptance.   Dental advisory given  Plan Discussed with: CRNA  Anesthesia Plan Comments:         Anesthesia Quick Evaluation

## 2017-03-19 NOTE — H&P (Signed)
Savannah Cabrera is a 27 y.o. female presenting for labor. OB History    Gravida Para Term Preterm AB Living   2             SAB TAB Ectopic Multiple Live Births                 Past Medical History:  Diagnosis Date  . Anxiety   . Hypertension    Past Surgical History:  Procedure Laterality Date  . TONSILLECTOMY     Family History: family history includes Diabetes in her paternal grandmother; Heart disease in her paternal grandfather; Hypertension in her father and paternal grandmother; Thyroid disease in her mother and sister. Social History:  reports that  has never smoked. she has never used smokeless tobacco. She reports that she does not drink alcohol or use drugs.     Maternal Diabetes: No Genetic Screening: Normal Maternal Ultrasounds/Referrals: Normal Fetal Ultrasounds or other Referrals:  None Maternal Substance Abuse:  No Significant Maternal Medications:  None Significant Maternal Lab Results:  None Other Comments:  None  ROS History Dilation: 4 Effacement (%): 80 Station: -2 Exam by:: F. Morris, RNC Blood pressure 138/76, pulse (!) 115, temperature 98.2 F (36.8 C), temperature source Oral, resp. rate 18, unknown if currently breastfeeding. Exam Physical Exam  Prenatal labs: ABO, Rh:   Antibody:   Rubella:   RPR:    HBsAg:    HIV:    GBS:     Assessment/Plan: 27 yo G2P0010 @ 39.3 wga presenting in likely labor. SVE 4/80/-2. Has declined cervical exams in the office and thus no baseline exam for comparision. Patient appears uncomf and in labor - contracting q 2-4 min. Thus, admit for suspected labor. Plan AROM when able and expectant manag't for now. Gbs neg.    Savannah Cabrera 03/19/2017, 11:29 AM

## 2017-03-20 LAB — RPR: RPR Ser Ql: NONREACTIVE

## 2017-03-20 MED ORDER — ACETAMINOPHEN 500 MG PO TABS
1000.0000 mg | ORAL_TABLET | Freq: Four times a day (QID) | ORAL | Status: AC
  Administered 2017-03-20 (×3): 1000 mg via ORAL
  Filled 2017-03-20 (×3): qty 2

## 2017-03-20 MED ORDER — KETOROLAC TROMETHAMINE 30 MG/ML IJ SOLN
INTRAMUSCULAR | Status: AC
  Administered 2017-03-20: 30 mg via INTRAVENOUS
  Filled 2017-03-20: qty 1

## 2017-03-20 MED ORDER — NALBUPHINE HCL 10 MG/ML IJ SOLN: 5.0000 mg | Freq: Once | INTRAMUSCULAR | Status: DC | PRN

## 2017-03-20 MED ORDER — OXYTOCIN 40 UNITS IN LACTATED RINGERS INFUSION - SIMPLE MED: 2.5000 [IU]/h | INTRAVENOUS | Status: AC

## 2017-03-20 MED ORDER — IBUPROFEN 600 MG PO TABS
600.0000 mg | ORAL_TABLET | Freq: Four times a day (QID) | ORAL | Status: DC
  Administered 2017-03-20 – 2017-03-21 (×5): 600 mg via ORAL
  Filled 2017-03-20 (×6): qty 1

## 2017-03-20 MED ORDER — SIMETHICONE 80 MG PO CHEW: 80.0000 mg | CHEWABLE_TABLET | ORAL | Status: DC | PRN

## 2017-03-20 MED ORDER — OXYCODONE HCL 5 MG PO TABS
5.0000 mg | ORAL_TABLET | ORAL | Status: DC | PRN
Start: 2017-03-20 — End: 2017-03-21
  Administered 2017-03-20 – 2017-03-21 (×3): 5 mg via ORAL
  Filled 2017-03-20 (×3): qty 1

## 2017-03-20 MED ORDER — DIPHENHYDRAMINE HCL 50 MG/ML IJ SOLN: 12.5000 mg | INTRAMUSCULAR | Status: DC | PRN

## 2017-03-20 MED ORDER — NALOXONE HCL 0.4 MG/ML IJ SOLN: 0.4000 mg | INTRAMUSCULAR | Status: DC | PRN

## 2017-03-20 MED ORDER — PRENATAL MULTIVITAMIN CH
1.0000 | ORAL_TABLET | Freq: Every day | ORAL | Status: DC
  Administered 2017-03-20 – 2017-03-21 (×2): 1 via ORAL
  Filled 2017-03-20 (×2): qty 1

## 2017-03-20 MED ORDER — KETOROLAC TROMETHAMINE 30 MG/ML IJ SOLN
30.0000 mg | Freq: Four times a day (QID) | INTRAMUSCULAR | Status: AC | PRN
  Administered 2017-03-20 (×2): 30 mg via INTRAVENOUS
  Filled 2017-03-20: qty 1

## 2017-03-20 MED ORDER — NALBUPHINE HCL 10 MG/ML IJ SOLN: 5.0000 mg | INTRAMUSCULAR | Status: DC | PRN

## 2017-03-20 MED ORDER — DIPHENHYDRAMINE HCL 25 MG PO CAPS: 25.0000 mg | ORAL_CAPSULE | ORAL | Status: DC | PRN

## 2017-03-20 MED ORDER — PROMETHAZINE HCL 25 MG/ML IJ SOLN
INTRAMUSCULAR | Status: AC
  Filled 2017-03-20: qty 1

## 2017-03-20 MED ORDER — SCOPOLAMINE 1 MG/3DAYS TD PT72
1.0000 | MEDICATED_PATCH | Freq: Once | TRANSDERMAL | Status: DC
  Filled 2017-03-20: qty 1

## 2017-03-20 MED ORDER — OXYCODONE HCL 5 MG PO TABS: 10.0000 mg | ORAL_TABLET | ORAL | Status: DC | PRN

## 2017-03-20 MED ORDER — DEXTROSE 5 % IV SOLN
1.0000 ug/kg/h | INTRAVENOUS | Status: DC | PRN
  Filled 2017-03-20: qty 5

## 2017-03-20 MED ORDER — MENTHOL 3 MG MT LOZG: 1.0000 | LOZENGE | OROMUCOSAL | Status: DC | PRN

## 2017-03-20 MED ORDER — ACETAMINOPHEN 325 MG PO TABS
650.0000 mg | ORAL_TABLET | ORAL | Status: DC | PRN
  Administered 2017-03-21: 650 mg via ORAL
  Filled 2017-03-20: qty 2

## 2017-03-20 MED ORDER — DIBUCAINE 1 % RE OINT: 1.0000 "application " | TOPICAL_OINTMENT | RECTAL | Status: DC | PRN

## 2017-03-20 MED ORDER — DIPHENHYDRAMINE HCL 25 MG PO CAPS: 25.0000 mg | ORAL_CAPSULE | Freq: Four times a day (QID) | ORAL | Status: DC | PRN

## 2017-03-20 MED ORDER — SODIUM CHLORIDE 0.9% FLUSH: 3.0000 mL | INTRAVENOUS | Status: DC | PRN

## 2017-03-20 MED ORDER — LACTATED RINGERS IV BOLUS (SEPSIS)
500.0000 mL | Freq: Once | INTRAVENOUS | Status: AC
  Administered 2017-03-20: 500 mL via INTRAVENOUS

## 2017-03-20 MED ORDER — LACTATED RINGERS IV SOLN
INTRAVENOUS | Status: DC
  Administered 2017-03-20: 07:00:00 via INTRAVENOUS

## 2017-03-20 MED ORDER — WITCH HAZEL-GLYCERIN EX PADS: 1.0000 "application " | MEDICATED_PAD | CUTANEOUS | Status: DC | PRN

## 2017-03-20 MED ORDER — SENNOSIDES-DOCUSATE SODIUM 8.6-50 MG PO TABS
2.0000 | ORAL_TABLET | ORAL | Status: DC
  Administered 2017-03-20: 2 via ORAL
  Filled 2017-03-20: qty 2

## 2017-03-20 MED ORDER — TETANUS-DIPHTH-ACELL PERTUSSIS 5-2.5-18.5 LF-MCG/0.5 IM SUSP: 0.5000 mL | Freq: Once | INTRAMUSCULAR | Status: DC

## 2017-03-20 MED ORDER — KETOROLAC TROMETHAMINE 30 MG/ML IJ SOLN: 30.0000 mg | Freq: Four times a day (QID) | INTRAMUSCULAR | Status: AC | PRN

## 2017-03-20 MED ORDER — RHO D IMMUNE GLOBULIN 1500 UNIT/2ML IJ SOSY
300.0000 ug | PREFILLED_SYRINGE | Freq: Once | INTRAMUSCULAR | Status: AC
  Administered 2017-03-20: 300 ug via INTRAVENOUS
  Filled 2017-03-20: qty 2

## 2017-03-20 MED ORDER — ZOLPIDEM TARTRATE 5 MG PO TABS: 5.0000 mg | ORAL_TABLET | Freq: Every evening | ORAL | Status: DC | PRN

## 2017-03-20 MED ORDER — ONDANSETRON HCL 4 MG/2ML IJ SOLN: 4.0000 mg | Freq: Three times a day (TID) | INTRAMUSCULAR | Status: DC | PRN

## 2017-03-20 MED ORDER — SIMETHICONE 80 MG PO CHEW
80.0000 mg | CHEWABLE_TABLET | ORAL | Status: DC
  Administered 2017-03-20: 80 mg via ORAL
  Filled 2017-03-20: qty 1

## 2017-03-20 MED ORDER — IBUPROFEN 600 MG PO TABS: 600.0000 mg | ORAL_TABLET | Freq: Four times a day (QID) | ORAL | Status: DC

## 2017-03-20 MED ORDER — COCONUT OIL OIL
1.0000 | TOPICAL_OIL | Status: DC | PRN
Start: 2017-03-20 — End: 2017-03-21

## 2017-03-20 MED ORDER — SIMETHICONE 80 MG PO CHEW
80.0000 mg | CHEWABLE_TABLET | Freq: Three times a day (TID) | ORAL | Status: DC
  Administered 2017-03-20 – 2017-03-21 (×5): 80 mg via ORAL
  Filled 2017-03-20 (×5): qty 1

## 2017-03-20 NOTE — Addendum Note (Signed)
Addendum  created 03/20/17 0808 by Shanon PayorGregory, Etty Isaac M, CRNA   Sign clinical note

## 2017-03-20 NOTE — Anesthesia Postprocedure Evaluation (Signed)
Anesthesia Post Note  Patient: Kyra Leylandmanda Pinon  Procedure(s) Performed: CESAREAN SECTION (N/A )     Patient location during evaluation: Mother Baby Anesthesia Type: Spinal Level of consciousness: awake and alert and oriented Pain management: satisfactory to patient Vital Signs Assessment: post-procedure vital signs reviewed and stable Respiratory status: spontaneous breathing and nonlabored ventilation Cardiovascular status: stable Postop Assessment: no headache, no backache, patient able to bend at knees, no signs of nausea or vomiting and adequate PO intake Anesthetic complications: no    Last Vitals:  Vitals:   03/20/17 0400 03/20/17 0509  BP: 117/64   Pulse: 99   Resp: 18 20  Temp: 36.8 C 36.7 C  SpO2: 97% 98%    Last Pain:  Vitals:   03/20/17 0648  TempSrc:   PainSc: 0-No pain   Pain Goal:                 Madison HickmanGREGORY,Mael Delap

## 2017-03-20 NOTE — Lactation Note (Signed)
This note was copied from a baby'Cabrera chart. Lactation Consultation Note  Patient Name: Savannah Kyra Leylandmanda Blackley WUJWJ'XToday'Cabrera Date: 03/20/2017 Reason for consult: Initial assessment;Primapara;Term Breastfeeding consultation services and support information given and reviewed.  This is mom'Cabrera first baby and newborn is 3112 hours old.  Mom reports newborn fed very well initially but has been spitty this morning.  Instructed to watch for feeding cues and call for assist prn.  Mom states she knows how to hand express and has seen colostrum.  Maternal Data Has patient been taught Hand Expression?: Yes Does the patient have breastfeeding experience prior to this delivery?: No  Feeding Feeding Type: Breast Fed Length of feed: 5 min  LATCH Score Latch: Repeated attempts needed to sustain latch, nipple held in mouth throughout feeding, stimulation needed to elicit sucking reflex.  Audible Swallowing: A few with stimulation  Type of Nipple: Everted at rest and after stimulation  Comfort (Breast/Nipple): Soft / non-tender  Hold (Positioning): Assistance needed to correctly position infant at breast and maintain latch.  LATCH Score: 7  Interventions    Lactation Tools Discussed/Used     Consult Status Consult Status: Follow-up Date: 03/21/17 Follow-up type: In-patient    Huston FoleyMOULDEN, Savannah Cabrera 03/20/2017, 10:32 AM

## 2017-03-20 NOTE — Anesthesia Postprocedure Evaluation (Signed)
Anesthesia Post Note  Patient: Savannah Cabrera  Procedure(s) Performed: CESAREAN SECTION (N/A )     Patient location during evaluation: PACU Anesthesia Type: Spinal Level of consciousness: oriented and awake and alert Pain management: pain level controlled Vital Signs Assessment: post-procedure vital signs reviewed and stable Respiratory status: spontaneous breathing, respiratory function stable and nonlabored ventilation Cardiovascular status: blood pressure returned to baseline and stable Postop Assessment: no headache, no backache, no apparent nausea or vomiting and spinal receding Anesthetic complications: no    Last Vitals:  Vitals:   03/20/17 0039 03/20/17 0057  BP:  135/71  Pulse: 100 (!) 114  Resp: (!) 33 20  Temp:  37.2 C  SpO2: 95% 98%    Last Pain:  Vitals:   03/20/17 0057  TempSrc: Oral  PainSc:    Pain Goal:                 Beryle Lathehomas E Brock

## 2017-03-20 NOTE — Progress Notes (Signed)
Subjective: Postpartum Day 1: Cesarean Delivery Patient reports tolerating PO.    Objective: Vital signs in last 24 hours: Temp:  [98 F (36.7 C)-99 F (37.2 C)] 98 F (36.7 C) (01/03 0509) Pulse Rate:  [99-159] 99 (01/03 0400) Resp:  [14-33] 20 (01/03 0509) BP: (93-152)/(33-99) 117/64 (01/03 0400) SpO2:  [95 %-100 %] 98 % (01/03 0509) Weight:  [319 lb (144.7 kg)-321 lb 11.2 oz (145.9 kg)] 321 lb 11.2 oz (145.9 kg) (01/02 1420)  Physical Exam:  General: alert, cooperative and no distress Lochia: appropriate Uterine Fundus: firm Incision: healing well DVT Evaluation: No evidence of DVT seen on physical exam.  Recent Labs    03/19/17 1142 03/20/17 0532  HGB 14.1 9.9*  HCT 43.1 30.5*    Assessment/Plan: Status post Cesarean section. Doing well postoperatively.  Continue current care.  Roselle LocusJames E Sacha Radloff II 03/20/2017, 8:25 AM

## 2017-03-20 NOTE — Progress Notes (Signed)

## 2017-03-20 NOTE — Progress Notes (Signed)
On assessment, pt output noted to be about 4625mls/hr.  Dr. Henderson Cloudomblin notified and ordered a 500ml LR bolus to be given over 1 hour.  Will continue to monitor.

## 2017-03-21 ENCOUNTER — Encounter (HOSPITAL_COMMUNITY): Payer: Self-pay | Admitting: *Deleted

## 2017-03-21 LAB — RH IG WORKUP (INCLUDES ABO/RH)
ABO/RH(D): O NEG
FETAL SCREEN: NEGATIVE
Gestational Age(Wks): 39.3
UNIT DIVISION: 0

## 2017-03-21 LAB — CBC
HEMATOCRIT: 30.5 % — AB (ref 36.0–46.0)
HEMOGLOBIN: 9.9 g/dL — AB (ref 12.0–15.0)
MCH: 29 pg (ref 26.0–34.0)
MCHC: 32.5 g/dL (ref 30.0–36.0)
MCV: 89.4 fL (ref 78.0–100.0)
Platelets: 209 10*3/uL (ref 150–400)
RBC: 3.41 MIL/uL — AB (ref 3.87–5.11)
RDW: 14.9 % (ref 11.5–15.5)
WBC: 27.4 10*3/uL — AB (ref 4.0–10.5)

## 2017-03-21 MED ORDER — IBUPROFEN 600 MG PO TABS: 600.0000 mg | ORAL_TABLET | Freq: Four times a day (QID) | ORAL | 0 refills | Status: DC

## 2017-03-21 MED ORDER — OXYCODONE-ACETAMINOPHEN 5-325 MG PO TABS: 1.0000 | ORAL_TABLET | ORAL | 0 refills | Status: DC | PRN

## 2017-03-21 NOTE — Progress Notes (Signed)
Patient took a shower and while taking pressure dressing off the honeycomb dressing lifted up. Notified Dr. Renaldo FiddlerAdkins. Verbal orders to apply a new Honeycomb dressing. Patient had a dizzy spell and saw spots at the end of her shower. She had been looking down attempting to take off dressing. Assisted patient out of shower and sat her on the toilet, patient felt better after sitting and drinking some juice. Able to ambulate to the chair in room on her own. Dr. Renaldo FiddlerAdkins notified of situation.

## 2017-03-21 NOTE — Discharge Summary (Signed)
Obstetric Discharge Summary Reason for Admission: onset of labor Prenatal Procedures: ultrasound Intrapartum Procedures: cesarean: low cervical, transverse Postpartum Procedures: none Complications-Operative and Postpartum: none Hemoglobin  Date Value Ref Range Status  03/20/2017 9.9 (L) 12.0 - 15.0 g/dL Final   HCT  Date Value Ref Range Status  03/20/2017 30.5 (L) 36.0 - 46.0 % Final    Physical Exam:  General: alert and cooperative Lochia: appropriate Uterine Fundus: firm Incision: no significant drainage DVT Evaluation: No evidence of DVT seen on physical exam.  Discharge Diagnoses: Term Pregnancy-delivered  Discharge Information: Date: 03/21/2017 Activity: pelvic rest Diet: routine Medications: PNV, Ibuprofen and Percocet Condition: stable Instructions: refer to practice specific booklet Discharge to: home Follow-up Information    [facility] Schedule an appointment as soon as possible for a visit in 1 week(s).   Contact information: [address] [place] [address] [phone]           Newborn Data: Live born female  Birth Weight: 11 lb 2.5 oz (5060 g) APGAR: 9, 9  Newborn Delivery   Birth date/time:  03/19/2017 22:15:00 Delivery type:  C-Section, Low Transverse C-section categorization:  Primary     Home with mother.  Savannah Cabrera 03/21/2017, 8:26 AM

## 2017-07-19 IMAGING — US US OB TRANSVAGINAL
1 series · 15 of 28 positions shown · non-contrast
Comparison: None.

CLINICAL DATA: Bleeding.  Pregnant patient.

EXAM:
OBSTETRIC <14 WK US AND TRANSVAGINAL OB US
TECHNIQUE: Both transabdominal and transvaginal ultrasound examinations were
performed for complete evaluation of the gestation as well as the
maternal uterus, adnexal regions, and pelvic cul-de-sac.
Transvaginal technique was performed to assess early pregnancy.

[Series 1: us ob transvaginal · 15 of 53 slices shown]
[im 1/53]
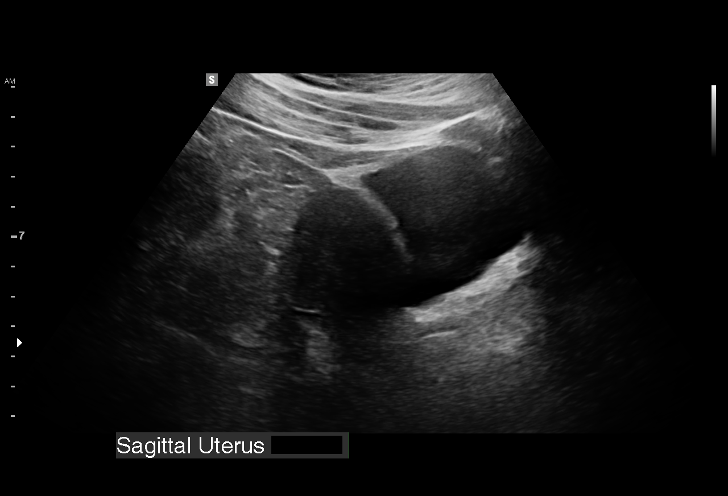
[im 4/53]
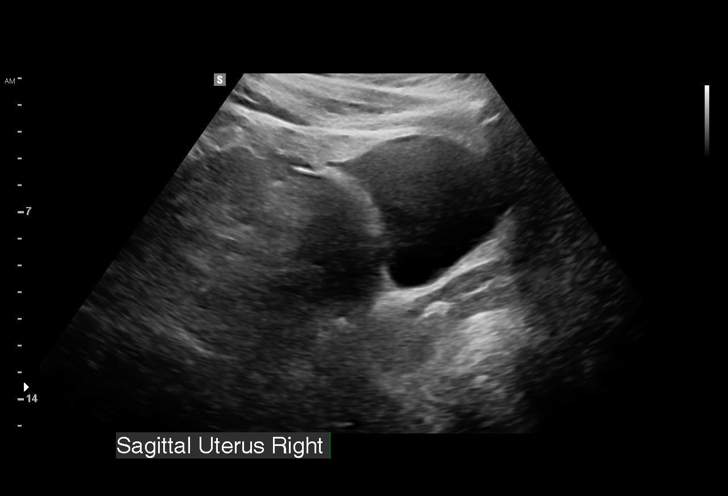
[im 8/53]
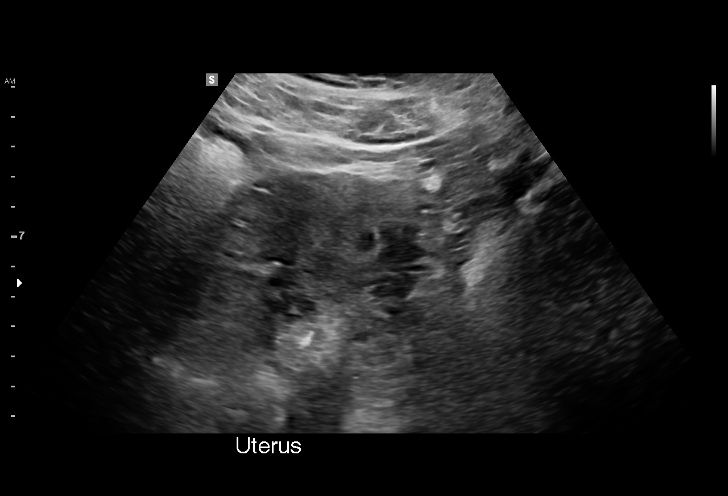
[im 12/53]
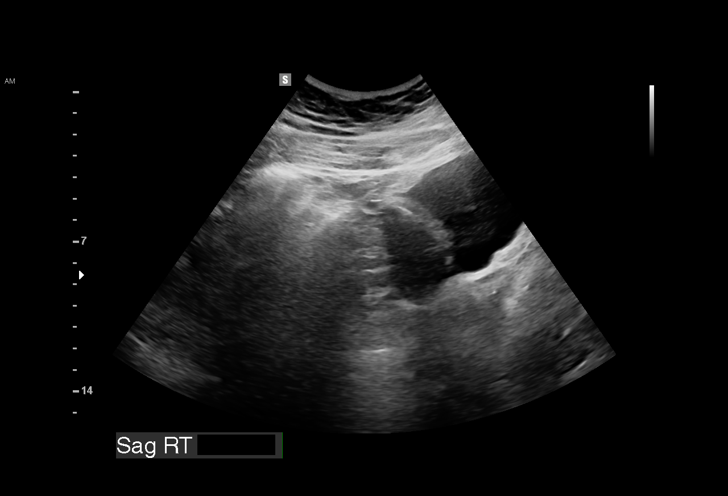
[im 16/53]
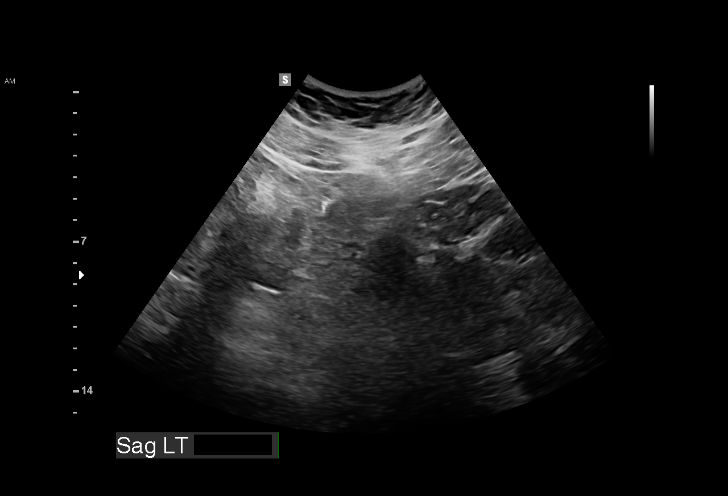
[im 20/53]
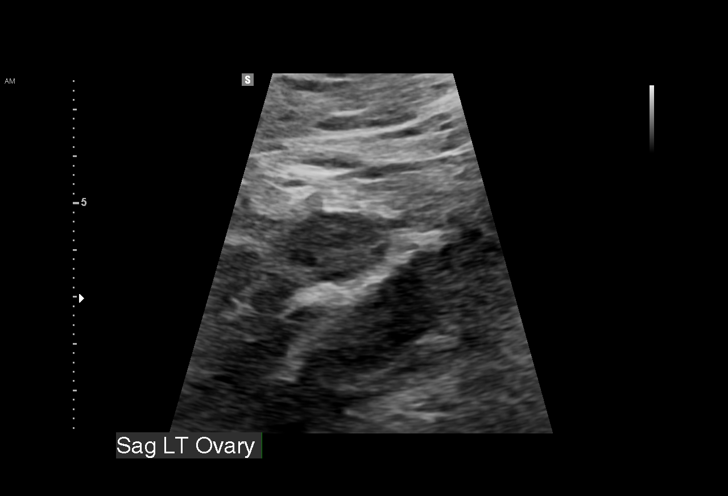
[im 24/53]
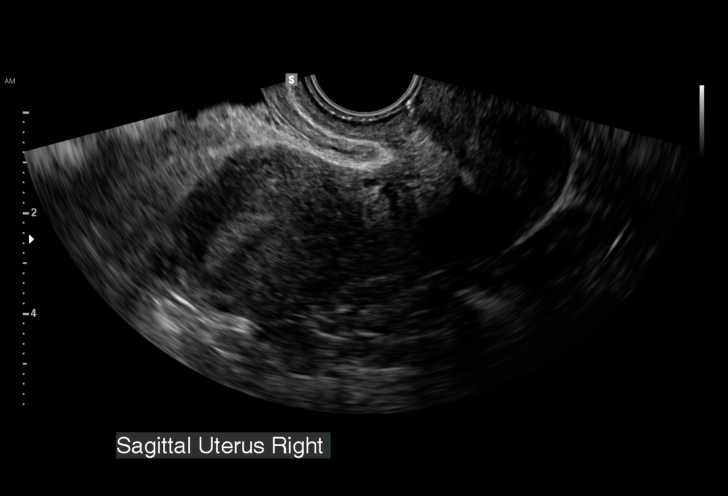
[im 27/53]
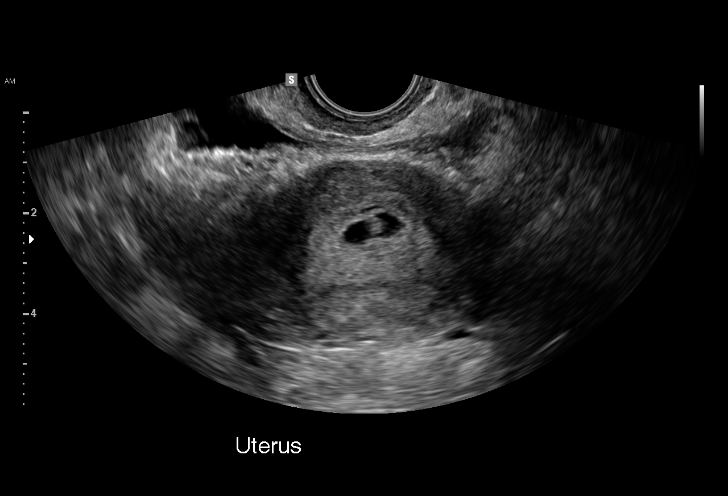
[im 29/53]
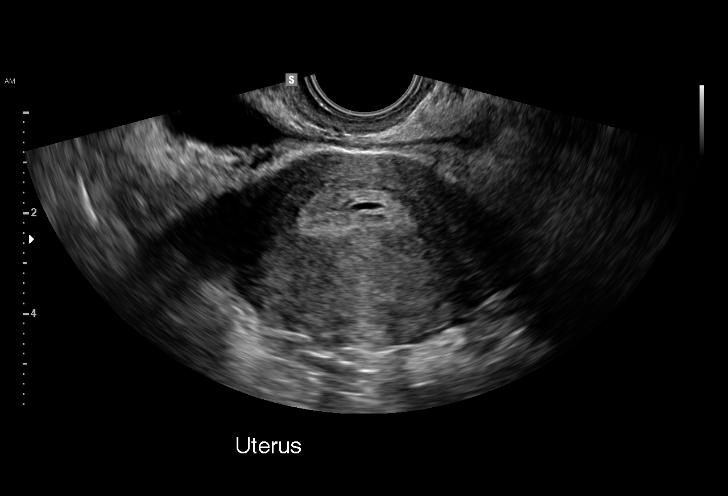
[im 33/53]
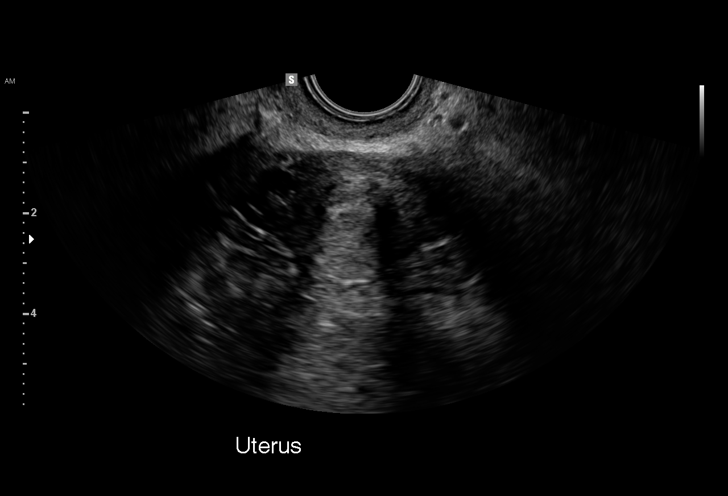
[im 37/53]
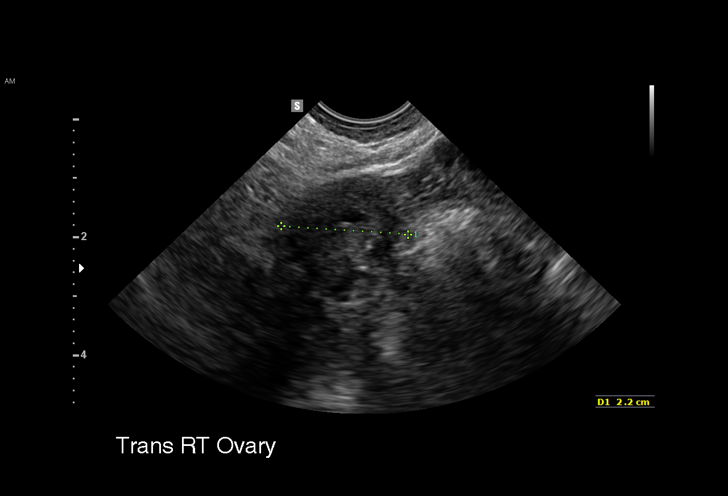
[im 41/53]
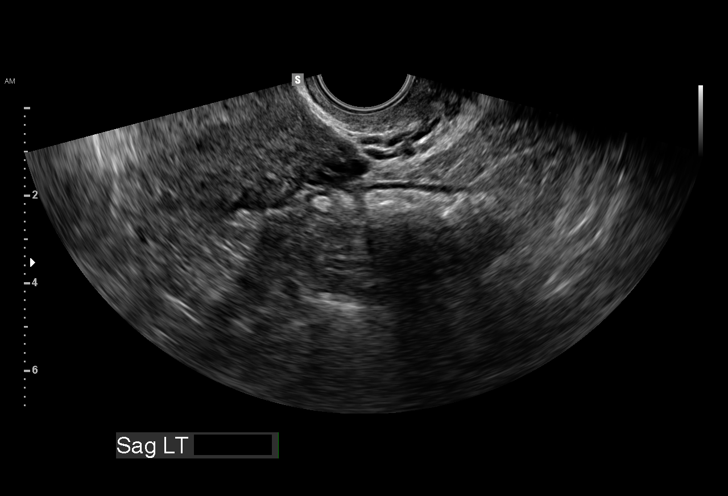
[im 45/53]
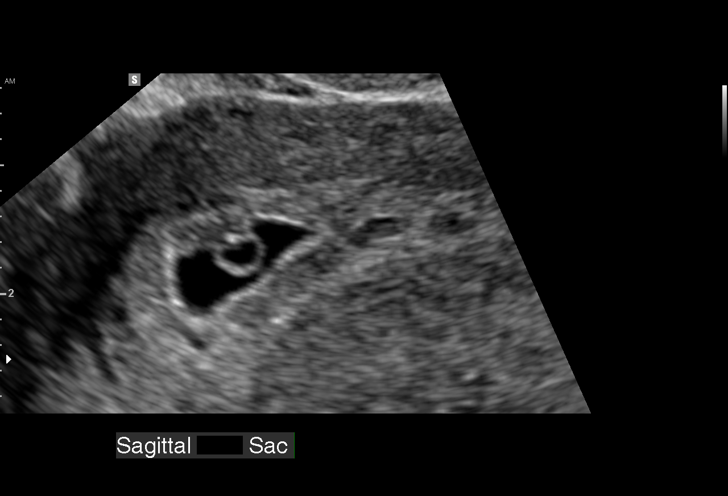
[im 49/53]
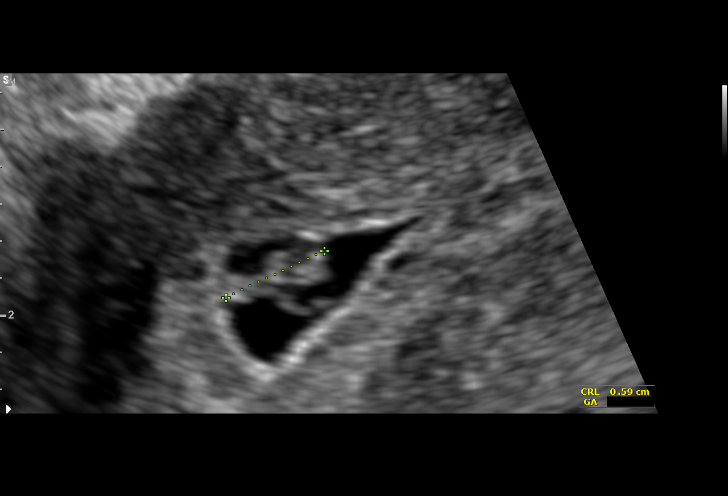
[im 53/53]
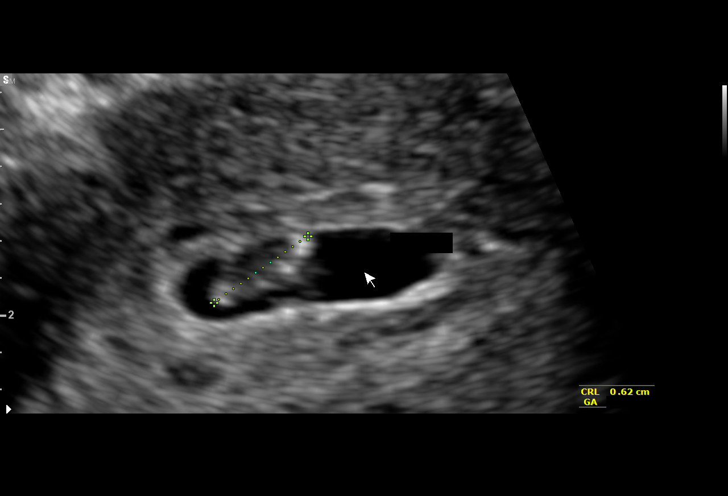

[15 of 28 positions shown; findings below may reference images not displayed]

FINDINGS: Intrauterine gestational sac: A single intrauterine gestational sac
is identified.

Yolk sac:  Present

Embryo:  A single embryo is identified

Cardiac Activity: Present

Heart Rate: 129 beats per minute

CRL:  6  mm   6 w   3 d                  US EDC: June 11, 2016

Subchorionic hemorrhage:  None visualized.

Maternal uterus/adnexae: The ovaries are unremarkable. The left
ovary was only seen transabdominally.
IMPRESSION: 1. Single live IUP.  No cause for bleeding identified.

## 2017-07-21 IMAGING — US US OB TRANSVAGINAL
1 series · 15 of 24 positions shown · non-contrast
Comparison: Pelvic ultrasound 10/20/2015

CLINICAL DATA: Pregnant patient with vaginal bleeding.

EXAM:
TRANSVAGINAL OB ULTRASOUND
TECHNIQUE: Transvaginal ultrasound was performed for complete evaluation of the
gestation as well as the maternal uterus, adnexal regions, and
pelvic cul-de-sac.

[Series 1: us ob transvaginal · 24 acquisitions, 15 frames shown]
[im 1/24]
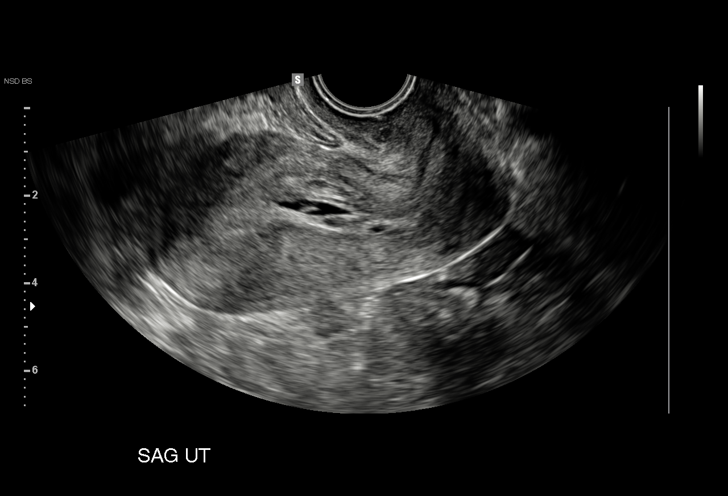
[im 3/24]
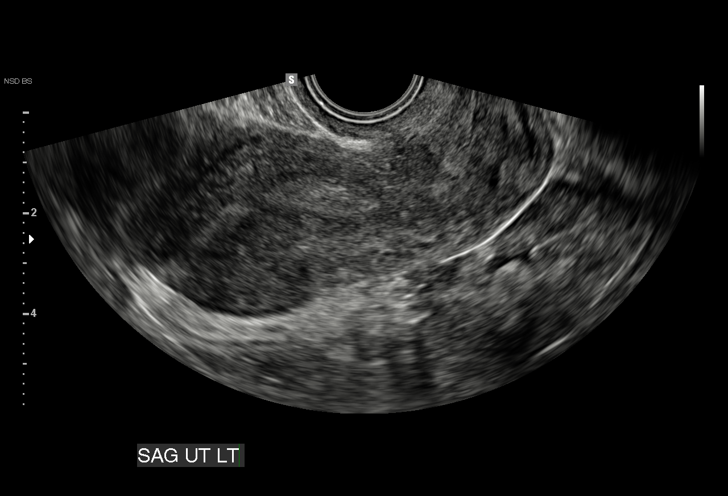
[im 5/24]
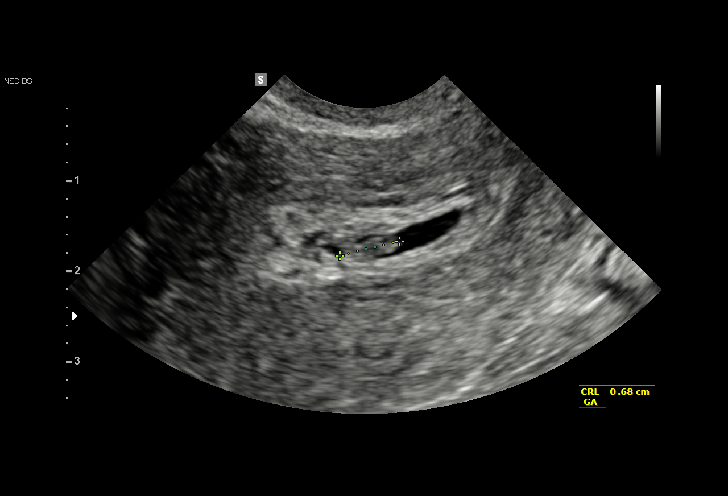
[im 6/24]
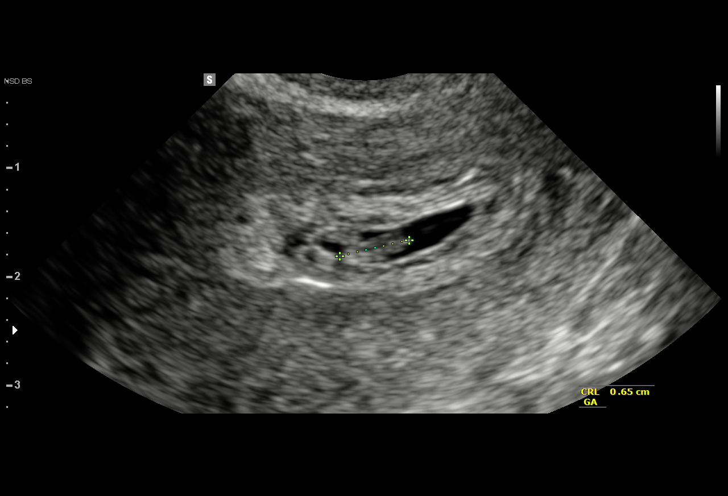
[im 8/24]
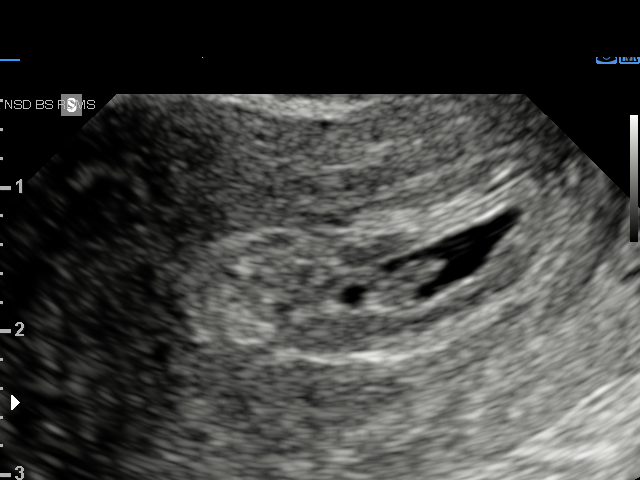
[im 9/24]
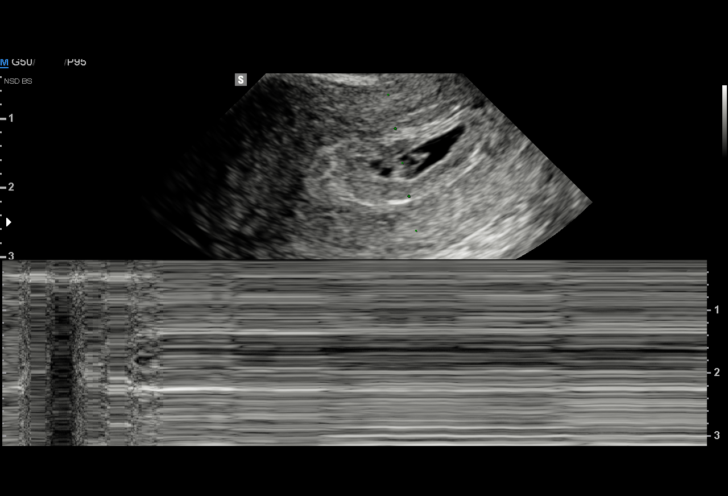
[im 11/24]
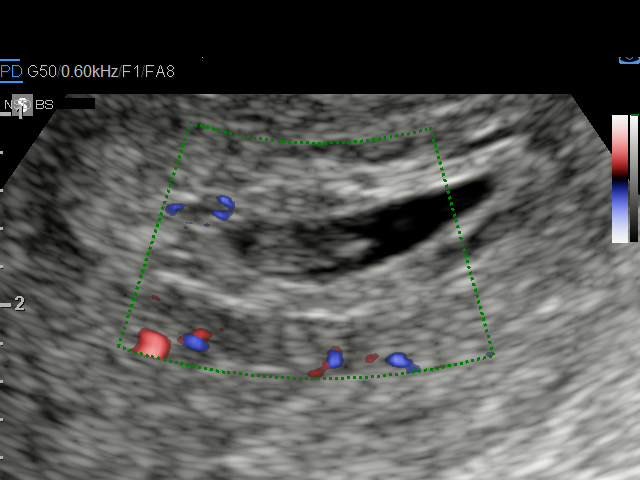
[im 13/24]
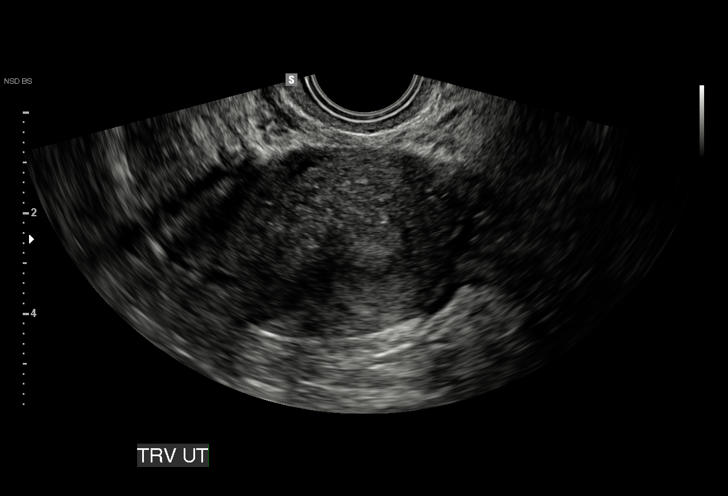
[im 14/24]
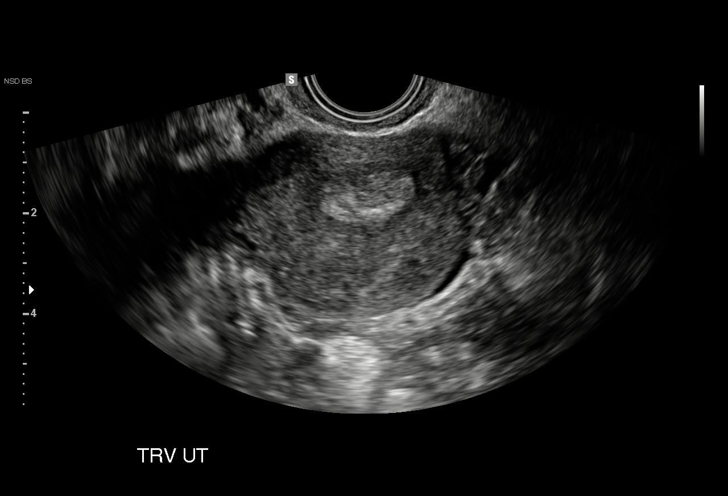
[im 16/24]
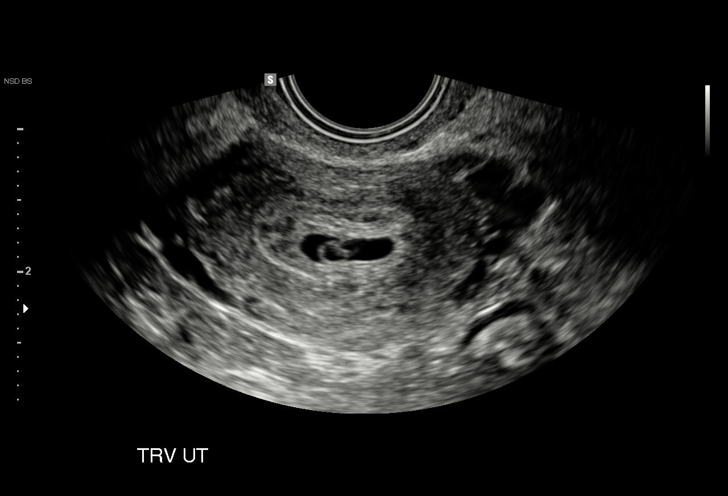
[im 17/24]
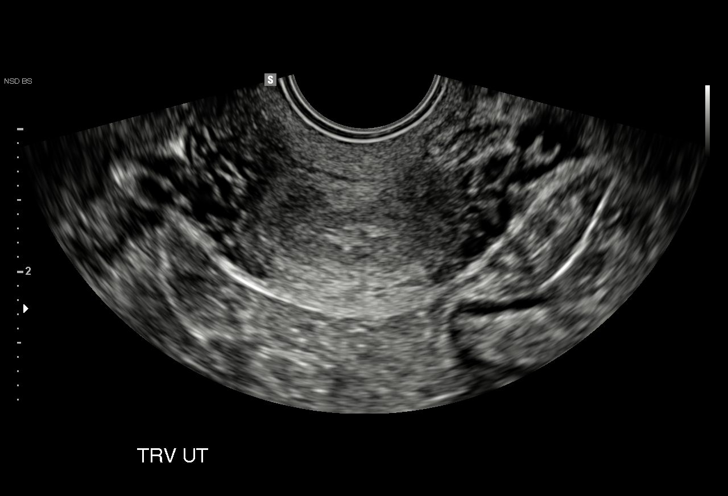
[im 19/24]
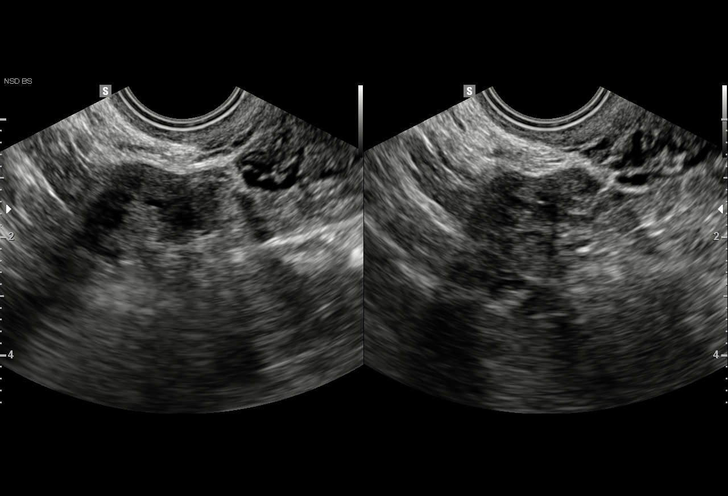
[im 21/24]
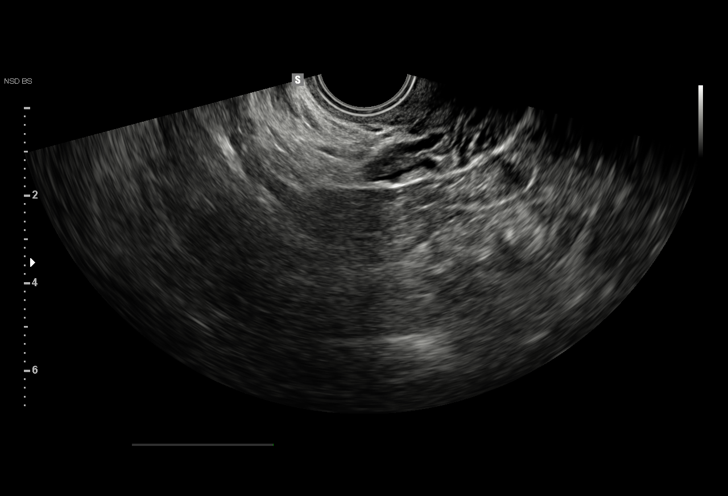
[im 22/24]
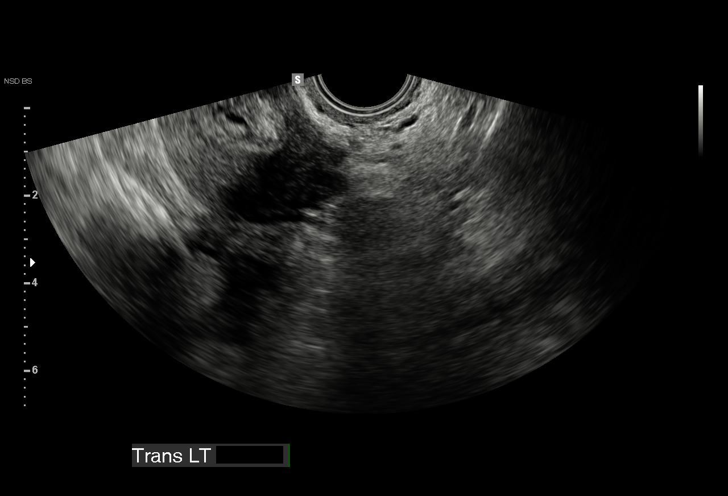
[im 24/24]
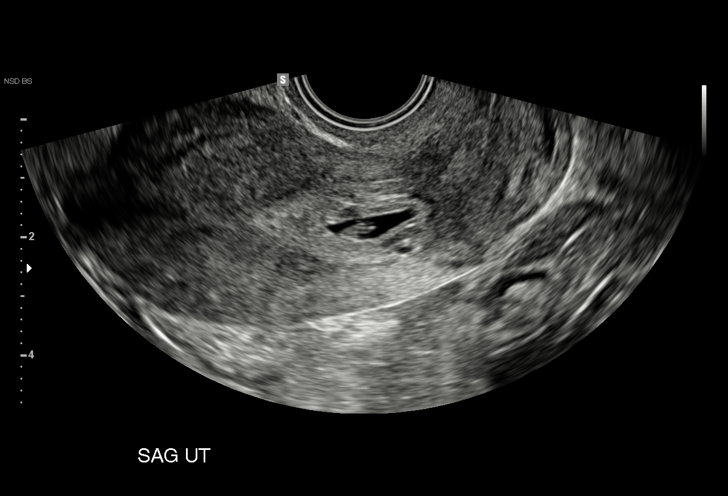

[15 of 24 positions shown; findings below may reference images not displayed]

FINDINGS: Intrauterine gestational sac: Single

Yolk sac:  Present

Embryo:  Present

Cardiac Activity: Not present

Heart Rate: 0 bpm

CRL:   6.5  mm   6 w 4 d

Subchorionic hemorrhage:  None visualized.

Maternal uterus/adnexae: Normal right ovary. Left ovary not
visualized. No free fluid in the pelvis.
IMPRESSION: Intrauterine gestation with crown-rump length of 6.5 mm. No cardiac
activity identified on current exam. Findings are suspicious but not
yet definitive for failed pregnancy. Recommend follow-up US in 10-14
days for definitive diagnosis. This recommendation follows SRU
consensus guidelines: Diagnostic Criteria for Nonviable Pregnancy
Early in the First Trimester. N Engl J Med 0847; [DATE].

## 2019-02-17 LAB — OB RESULTS CONSOLE HIV ANTIBODY (ROUTINE TESTING): HIV: NONREACTIVE

## 2019-02-17 LAB — OB RESULTS CONSOLE GC/CHLAMYDIA
Chlamydia: NEGATIVE
Gonorrhea: NEGATIVE

## 2019-02-17 LAB — OB RESULTS CONSOLE ABO/RH: RH Type: NEGATIVE

## 2019-02-17 LAB — OB RESULTS CONSOLE RUBELLA ANTIBODY, IGM: Rubella: IMMUNE

## 2019-02-17 LAB — OB RESULTS CONSOLE RPR: RPR: NONREACTIVE

## 2019-02-17 LAB — OB RESULTS CONSOLE ANTIBODY SCREEN: Antibody Screen: NEGATIVE

## 2019-02-17 LAB — OB RESULTS CONSOLE HEPATITIS B SURFACE ANTIGEN: Hepatitis B Surface Ag: NEGATIVE

## 2019-09-08 ENCOUNTER — Telehealth (HOSPITAL_COMMUNITY): Payer: Self-pay | Admitting: *Deleted

## 2019-09-08 ENCOUNTER — Encounter (HOSPITAL_COMMUNITY): Payer: Self-pay | Admitting: *Deleted

## 2019-09-08 NOTE — Telephone Encounter (Signed)
Preadmission screen  

## 2019-09-08 NOTE — Patient Instructions (Signed)
Savannah Cabrera  09/08/2019   Your procedure is scheduled on:  09/17/2019  Arrive at 0530 at Entrance C on [address] at [facility]  and [employer]. You are invited to use the FREE valet parking or use the Visitor's parking deck.  Pick up the phone at the desk and dial [phone].  Call this number if you have problems the morning of surgery: [phone]  Remember:   Do not eat food:(After Midnight) Desps de medianoche.  Do not drink clear liquids: (After Midnight) Desps de medianoche.  Take these medicines the morning of surgery with A SIP OF WATER:  none   Do not wear jewelry, make-up or nail polish.  Do not wear lotions, powders, or perfumes. Do not wear deodorant.  Do not shave 48 hours prior to surgery.  Do not bring valuables to the hospital.  [facility] is not   responsible for any belongings or valuables brought to the hospital.  Contacts, dentures or bridgework may not be worn into surgery.  Leave suitcase in the car. After surgery it may be brought to your room.  For patients admitted to the hospital, checkout time is 11:00 AM the day of              discharge.      Please read over the following fact sheets that you were given:     Preparing for Surgery

## 2019-09-13 ENCOUNTER — Encounter (HOSPITAL_COMMUNITY): Payer: Self-pay

## 2019-09-14 ENCOUNTER — Encounter (HOSPITAL_COMMUNITY): Payer: Self-pay

## 2019-09-15 ENCOUNTER — Encounter (HOSPITAL_COMMUNITY): Payer: Self-pay

## 2019-09-15 ENCOUNTER — Other Ambulatory Visit: Payer: Self-pay

## 2019-09-15 ENCOUNTER — Other Ambulatory Visit (HOSPITAL_COMMUNITY)
Admission: RE | Admit: 2019-09-15 | Discharge: 2019-09-15 | Disposition: A | Discharge status: Home / Self Care | Payer: 59 | Source: Ambulatory Visit | Attending: Obstetrics & Gynecology | Admitting: Obstetrics & Gynecology

## 2019-09-15 LAB — CBC
HCT: 35.5 % — ABNORMAL LOW (ref 36.0–46.0)
Hemoglobin: 11.6 g/dL — ABNORMAL LOW (ref 12.0–15.0)
MCH: 28.9 pg (ref 26.0–34.0)
MCHC: 32.7 g/dL (ref 30.0–36.0)
MCV: 88.5 fL (ref 80.0–100.0)
Platelets: 276 10*3/uL (ref 150–400)
RBC: 4.01 MIL/uL (ref 3.87–5.11)
RDW: 13.7 % (ref 11.5–15.5)
WBC: 11.9 10*3/uL — ABNORMAL HIGH (ref 4.0–10.5)
nRBC: 0 % (ref 0.0–0.2)

## 2019-09-15 LAB — RPR: RPR Ser Ql: NONREACTIVE

## 2019-09-15 LAB — SARS CORONAVIRUS 2 (TAT 6-24 HRS): SARS Coronavirus 2: NEGATIVE

## 2019-09-15 LAB — ABO/RH: ABO/RH(D): O NEG

## 2019-09-15 LAB — TYPE AND SCREEN
ABO/RH(D): O NEG
Antibody Screen: NEGATIVE

## 2019-09-15 NOTE — MAU Note (Signed)
Covid swab collected.Pt tolerated well. Asymptomatic. Waiting for labs to draw CBC/RPR/ Type and screen

## 2019-09-16 ENCOUNTER — Encounter (HOSPITAL_COMMUNITY): Payer: Self-pay | Admitting: Obstetrics and Gynecology

## 2019-09-16 NOTE — Anesthesia Preprocedure Evaluation (Addendum)
Anesthesia Evaluation  Patient identified by MRN, date of birth, ID band Patient awake    Reviewed: Allergy & Precautions, NPO status , Patient's Chart, lab work & pertinent test results  Airway Mallampati: II  TM Distance: >3 FB Neck ROM: Full    Dental no notable dental hx. (+) Teeth Intact   Pulmonary neg pulmonary ROS,    Pulmonary exam normal breath sounds clear to auscultation       Cardiovascular hypertension, Normal cardiovascular exam Rhythm:Regular Rate:Normal     Neuro/Psych Anxiety negative neurological ROS     GI/Hepatic Neg liver ROS, GERD  Medicated and Controlled,  Endo/Other  Morbid obesity  Renal/GU negative Renal ROS  negative genitourinary   Musculoskeletal negative musculoskeletal ROS (+)   Abdominal (+) + obese,   Peds  Hematology  (+) anemia ,   Anesthesia Other Findings   Reproductive/Obstetrics (+) Pregnancy Previous C/Section                            Anesthesia Physical Anesthesia Plan  ASA: III  Anesthesia Plan: Spinal   Post-op Pain Management:    Induction:   PONV Risk Score and Plan: 4 or greater and Scopolamine patch - Pre-op, Ondansetron and Treatment may vary due to age or medical condition  Airway Management Planned: Natural Airway  Additional Equipment:   Intra-op Plan:   Post-operative Plan:   Informed Consent: I have reviewed the patients History and Physical, chart, labs and discussed the procedure including the risks, benefits and alternatives for the proposed anesthesia with the patient or authorized representative who has indicated his/her understanding and acceptance.     Dental advisory given  Plan Discussed with: CRNA and Surgeon  Anesthesia Plan Comments:        Anesthesia Quick Evaluation

## 2019-09-17 ENCOUNTER — Encounter (HOSPITAL_COMMUNITY)
Admission: AD | Disposition: A | Discharge status: Home / Self Care | Payer: Self-pay | Source: Home / Self Care | Attending: Obstetrics and Gynecology

## 2019-09-17 ENCOUNTER — Inpatient Hospital Stay (HOSPITAL_COMMUNITY): Payer: 59 | Admitting: Anesthesiology

## 2019-09-17 ENCOUNTER — Encounter (HOSPITAL_COMMUNITY): Payer: Self-pay | Admitting: Obstetrics and Gynecology

## 2019-09-17 ENCOUNTER — Inpatient Hospital Stay (HOSPITAL_COMMUNITY): Admission: RE | Admit: 2019-09-17 | Payer: 59 | Source: Home / Self Care | Admitting: Obstetrics and Gynecology

## 2019-09-17 ENCOUNTER — Inpatient Hospital Stay (HOSPITAL_COMMUNITY)
Admission: AD | Admit: 2019-09-17 | Discharge: 2019-09-19 | DRG: 788 | Disposition: A | Discharge status: Home / Self Care | Payer: 59 | Attending: Obstetrics and Gynecology | Admitting: Obstetrics and Gynecology

## 2019-09-17 ENCOUNTER — Other Ambulatory Visit: Payer: Self-pay

## 2019-09-17 DIAGNOSIS — Z20822 Contact with and (suspected) exposure to covid-19: Secondary | ICD-10-CM | POA: Diagnosis present

## 2019-09-17 DIAGNOSIS — O99214 Obesity complicating childbirth: Secondary | ICD-10-CM | POA: Diagnosis present

## 2019-09-17 DIAGNOSIS — Z3A39 39 weeks gestation of pregnancy: Secondary | ICD-10-CM | POA: Diagnosis not present

## 2019-09-17 DIAGNOSIS — O34211 Maternal care for low transverse scar from previous cesarean delivery: Secondary | ICD-10-CM | POA: Diagnosis present

## 2019-09-17 DIAGNOSIS — Z881 Allergy status to other antibiotic agents status: Secondary | ICD-10-CM

## 2019-09-17 DIAGNOSIS — Z98891 History of uterine scar from previous surgery: Secondary | ICD-10-CM

## 2019-09-17 DIAGNOSIS — O24420 Gestational diabetes mellitus in childbirth, diet controlled: Secondary | ICD-10-CM | POA: Diagnosis present

## 2019-09-17 LAB — COMPREHENSIVE METABOLIC PANEL
ALT: 9 U/L (ref 0–44)
AST: 17 U/L (ref 15–41)
Albumin: 2.5 g/dL — ABNORMAL LOW (ref 3.5–5.0)
Alkaline Phosphatase: 87 U/L (ref 38–126)
Anion gap: 10 (ref 5–15)
BUN: 7 mg/dL (ref 6–20)
CO2: 20 mmol/L — ABNORMAL LOW (ref 22–32)
Calcium: 8.1 mg/dL — ABNORMAL LOW (ref 8.9–10.3)
Chloride: 108 mmol/L (ref 98–111)
Creatinine, Ser: 0.55 mg/dL (ref 0.44–1.00)
GFR calc Af Amer: >60 mL/min (ref >60)
GFR calc non Af Amer: >60 mL/min (ref >60)
Glucose, Bld: 100 mg/dL — ABNORMAL HIGH (ref 70–99)
Potassium: 3.7 mmol/L (ref 3.5–5.1)
Sodium: 138 mmol/L (ref 135–145)
Total Bilirubin: 0.5 mg/dL (ref 0.3–1.2)
Total Protein: 5.3 g/dL — ABNORMAL LOW (ref 6.5–8.1)

## 2019-09-17 SURGERY — Surgical Case
Anesthesia: Spinal

## 2019-09-17 MED ORDER — SENNOSIDES-DOCUSATE SODIUM 8.6-50 MG PO TABS
2.0000 | ORAL_TABLET | ORAL | Status: DC
  Administered 2019-09-18 (×2): 2 via ORAL
  Filled 2019-09-17 (×2): qty 2

## 2019-09-17 MED ORDER — IBUPROFEN 600 MG PO TABS
600.0000 mg | ORAL_TABLET | Freq: Four times a day (QID) | ORAL | Status: DC | PRN
  Administered 2019-09-18 – 2019-09-19 (×3): 600 mg via ORAL
  Filled 2019-09-17 (×6): qty 1

## 2019-09-17 MED ORDER — LACTATED RINGERS IV SOLN: INTRAVENOUS | Status: DC

## 2019-09-17 MED ORDER — SOD CITRATE-CITRIC ACID 500-334 MG/5ML PO SOLN: 30.0000 mL | Freq: Once | ORAL | Status: DC

## 2019-09-17 MED ORDER — PROMETHAZINE HCL 25 MG/ML IJ SOLN
6.2500 mg | Freq: Once | INTRAMUSCULAR | Status: AC
  Administered 2019-09-17: 6.25 mg via INTRAVENOUS
  Filled 2019-09-17: qty 1

## 2019-09-17 MED ORDER — SODIUM CHLORIDE 0.9% FLUSH: 3.0000 mL | INTRAVENOUS | Status: DC | PRN

## 2019-09-17 MED ORDER — NALBUPHINE HCL 10 MG/ML IJ SOLN: 5.0000 mg | INTRAMUSCULAR | Status: DC | PRN

## 2019-09-17 MED ORDER — FENTANYL CITRATE (PF) 100 MCG/2ML IJ SOLN
INTRAMUSCULAR | Status: DC | PRN
  Administered 2019-09-17: 15 ug via INTRATHECAL

## 2019-09-17 MED ORDER — MORPHINE SULFATE (PF) 0.5 MG/ML IJ SOLN
INTRAMUSCULAR | Status: AC
  Filled 2019-09-17: qty 10

## 2019-09-17 MED ORDER — PHENYLEPHRINE HCL-NACL 20-0.9 MG/250ML-% IV SOLN
INTRAVENOUS | Status: DC | PRN
  Administered 2019-09-17: 60 ug/min via INTRAVENOUS

## 2019-09-17 MED ORDER — KETOROLAC TROMETHAMINE 30 MG/ML IJ SOLN
30.0000 mg | Freq: Four times a day (QID) | INTRAMUSCULAR | Status: AC | PRN
  Administered 2019-09-17 – 2019-09-18 (×3): 30 mg via INTRAVENOUS
  Filled 2019-09-17 (×3): qty 1

## 2019-09-17 MED ORDER — LACTATED RINGERS IV BOLUS
500.0000 mL | Freq: Once | INTRAVENOUS | Status: AC
  Administered 2019-09-17: 500 mL via INTRAVENOUS

## 2019-09-17 MED ORDER — SIMETHICONE 80 MG PO CHEW
80.0000 mg | CHEWABLE_TABLET | ORAL | Status: DC | PRN
  Administered 2019-09-18: 80 mg via ORAL
  Filled 2019-09-17 (×2): qty 1

## 2019-09-17 MED ORDER — COCONUT OIL OIL: 1.0000 "application " | TOPICAL_OIL | Status: DC | PRN

## 2019-09-17 MED ORDER — TETANUS-DIPHTH-ACELL PERTUSSIS 5-2.5-18.5 LF-MCG/0.5 IM SUSP: 0.5000 mL | Freq: Once | INTRAMUSCULAR | Status: DC

## 2019-09-17 MED ORDER — DEXTROSE 5 % IV SOLN
INTRAVENOUS | Status: AC
  Filled 2019-09-17: qty 3000

## 2019-09-17 MED ORDER — MEPERIDINE HCL 25 MG/ML IJ SOLN: 6.2500 mg | INTRAMUSCULAR | Status: DC | PRN

## 2019-09-17 MED ORDER — PHENYLEPHRINE 40 MCG/ML (10ML) SYRINGE FOR IV PUSH (FOR BLOOD PRESSURE SUPPORT)
PREFILLED_SYRINGE | INTRAVENOUS | Status: AC
  Filled 2019-09-17: qty 10

## 2019-09-17 MED ORDER — SCOPOLAMINE 1 MG/3DAYS TD PT72
MEDICATED_PATCH | TRANSDERMAL | Status: AC
  Filled 2019-09-17: qty 1

## 2019-09-17 MED ORDER — ALBUMIN HUMAN 5 % IV SOLN
INTRAVENOUS | Status: DC | PRN
Start: 2019-09-17 — End: 2019-09-17

## 2019-09-17 MED ORDER — NALBUPHINE HCL 10 MG/ML IJ SOLN: 5.0000 mg | Freq: Once | INTRAMUSCULAR | Status: DC | PRN

## 2019-09-17 MED ORDER — PHENYLEPHRINE HCL-NACL 20-0.9 MG/250ML-% IV SOLN
INTRAVENOUS | Status: AC
  Filled 2019-09-17: qty 250

## 2019-09-17 MED ORDER — EPHEDRINE SULFATE 50 MG/ML IJ SOLN
INTRAMUSCULAR | Status: DC | PRN
Start: 2019-09-17 — End: 2019-09-17
  Administered 2019-09-17: 5 mg via INTRAVENOUS
  Administered 2019-09-17 (×2): 10 mg via INTRAVENOUS

## 2019-09-17 MED ORDER — FENTANYL CITRATE (PF) 100 MCG/2ML IJ SOLN
INTRAMUSCULAR | Status: AC
  Filled 2019-09-17: qty 2

## 2019-09-17 MED ORDER — WITCH HAZEL-GLYCERIN EX PADS: 1.0000 "application " | MEDICATED_PAD | CUTANEOUS | Status: DC | PRN

## 2019-09-17 MED ORDER — DEXAMETHASONE SODIUM PHOSPHATE 10 MG/ML IJ SOLN
INTRAMUSCULAR | Status: AC
  Filled 2019-09-17: qty 1

## 2019-09-17 MED ORDER — MENTHOL 3 MG MT LOZG: 1.0000 | LOZENGE | OROMUCOSAL | Status: DC | PRN

## 2019-09-17 MED ORDER — OXYCODONE HCL 5 MG PO TABS
5.0000 mg | ORAL_TABLET | ORAL | Status: DC | PRN
  Administered 2019-09-17 – 2019-09-19 (×8): 5 mg via ORAL
  Filled 2019-09-17 (×8): qty 1

## 2019-09-17 MED ORDER — BUPIVACAINE IN DEXTROSE 0.75-8.25 % IT SOLN
INTRATHECAL | Status: DC | PRN
  Administered 2019-09-17: 1.8 mL via INTRATHECAL

## 2019-09-17 MED ORDER — HYDROMORPHONE HCL 1 MG/ML IJ SOLN: 0.2000 mg | INTRAMUSCULAR | Status: DC | PRN

## 2019-09-17 MED ORDER — FENTANYL CITRATE (PF) 100 MCG/2ML IJ SOLN
25.0000 ug | INTRAMUSCULAR | Status: DC | PRN
  Administered 2019-09-17: 50 ug via INTRAVENOUS

## 2019-09-17 MED ORDER — NALOXONE HCL 4 MG/10ML IJ SOLN
1.0000 ug/kg/h | INTRAVENOUS | Status: DC | PRN
  Filled 2019-09-17: qty 5

## 2019-09-17 MED ORDER — OXYTOCIN-SODIUM CHLORIDE 30-0.9 UT/500ML-% IV SOLN
INTRAVENOUS | Status: DC | PRN
  Administered 2019-09-17: 30 [IU] via INTRAVENOUS

## 2019-09-17 MED ORDER — SODIUM CHLORIDE 0.9 % IV SOLN
INTRAVENOUS | Status: DC | PRN
Start: 2019-09-17 — End: 2019-09-17

## 2019-09-17 MED ORDER — ALBUMIN HUMAN 5 % IV SOLN
INTRAVENOUS | Status: AC
  Filled 2019-09-17: qty 250

## 2019-09-17 MED ORDER — DIPHENHYDRAMINE HCL 25 MG PO CAPS: 25.0000 mg | ORAL_CAPSULE | Freq: Four times a day (QID) | ORAL | Status: DC | PRN

## 2019-09-17 MED ORDER — ONDANSETRON HCL 4 MG/2ML IJ SOLN
INTRAMUSCULAR | Status: DC | PRN
  Administered 2019-09-17: 4 mg via INTRAVENOUS

## 2019-09-17 MED ORDER — OXYTOCIN-SODIUM CHLORIDE 30-0.9 UT/500ML-% IV SOLN
INTRAVENOUS | Status: AC
  Filled 2019-09-17: qty 500

## 2019-09-17 MED ORDER — ONDANSETRON HCL 4 MG/2ML IJ SOLN
INTRAMUSCULAR | Status: AC
  Filled 2019-09-17: qty 2

## 2019-09-17 MED ORDER — KETOROLAC TROMETHAMINE 30 MG/ML IJ SOLN
30.0000 mg | Freq: Four times a day (QID) | INTRAMUSCULAR | Status: AC | PRN
  Administered 2019-09-17: 30 mg via INTRAMUSCULAR

## 2019-09-17 MED ORDER — SOD CITRATE-CITRIC ACID 500-334 MG/5ML PO SOLN
ORAL | Status: AC
  Filled 2019-09-17: qty 30

## 2019-09-17 MED ORDER — OXYTOCIN-SODIUM CHLORIDE 30-0.9 UT/500ML-% IV SOLN
INTRAVENOUS | Status: AC
  Administered 2019-09-17: 2.5 [IU]/h via INTRAVENOUS
  Filled 2019-09-17: qty 500

## 2019-09-17 MED ORDER — LACTATED RINGERS IV SOLN
INTRAVENOUS | Status: DC | PRN
Start: 2019-09-17 — End: 2019-09-17

## 2019-09-17 MED ORDER — ZOLPIDEM TARTRATE 5 MG PO TABS: 5.0000 mg | ORAL_TABLET | Freq: Every evening | ORAL | Status: DC | PRN

## 2019-09-17 MED ORDER — PRENATAL MULTIVITAMIN CH
1.0000 | ORAL_TABLET | Freq: Every day | ORAL | Status: DC
  Administered 2019-09-18: 1 via ORAL
  Filled 2019-09-17: qty 1

## 2019-09-17 MED ORDER — OXYTOCIN-SODIUM CHLORIDE 30-0.9 UT/500ML-% IV SOLN: 2.5000 [IU]/h | INTRAVENOUS | Status: AC

## 2019-09-17 MED ORDER — ACETAMINOPHEN 325 MG PO TABS
650.0000 mg | ORAL_TABLET | ORAL | Status: DC | PRN
  Administered 2019-09-17 – 2019-09-18 (×2): 650 mg via ORAL
  Filled 2019-09-17 (×2): qty 2

## 2019-09-17 MED ORDER — NALOXONE HCL 0.4 MG/ML IJ SOLN: 0.4000 mg | INTRAMUSCULAR | Status: DC | PRN

## 2019-09-17 MED ORDER — DIBUCAINE (PERIANAL) 1 % EX OINT: 1.0000 "application " | TOPICAL_OINTMENT | CUTANEOUS | Status: DC | PRN

## 2019-09-17 MED ORDER — SIMETHICONE 80 MG PO CHEW
80.0000 mg | CHEWABLE_TABLET | Freq: Three times a day (TID) | ORAL | Status: DC
  Administered 2019-09-17 – 2019-09-19 (×5): 80 mg via ORAL
  Filled 2019-09-17 (×5): qty 1

## 2019-09-17 MED ORDER — SIMETHICONE 80 MG PO CHEW
80.0000 mg | CHEWABLE_TABLET | ORAL | Status: DC
  Administered 2019-09-18 (×2): 80 mg via ORAL
  Filled 2019-09-17: qty 1

## 2019-09-17 MED ORDER — DEXTROSE 5 % IV SOLN
3.0000 g | INTRAVENOUS | Status: AC
  Administered 2019-09-17: 3 g via INTRAVENOUS

## 2019-09-17 MED ORDER — SCOPOLAMINE 1 MG/3DAYS TD PT72
1.0000 | MEDICATED_PATCH | Freq: Once | TRANSDERMAL | Status: DC
  Administered 2019-09-17: 1.5 mg via TRANSDERMAL

## 2019-09-17 MED ORDER — EPHEDRINE 5 MG/ML INJ
INTRAVENOUS | Status: AC
  Filled 2019-09-17: qty 10

## 2019-09-17 MED ORDER — MORPHINE SULFATE (PF) 0.5 MG/ML IJ SOLN
INTRAMUSCULAR | Status: DC | PRN
  Administered 2019-09-17: .15 mg via INTRATHECAL

## 2019-09-17 MED ORDER — DIPHENHYDRAMINE HCL 25 MG PO CAPS: 25.0000 mg | ORAL_CAPSULE | ORAL | Status: DC | PRN

## 2019-09-17 MED ORDER — SOD CITRATE-CITRIC ACID 500-334 MG/5ML PO SOLN
30.0000 mL | ORAL | Status: AC
  Administered 2019-09-17: 30 mL via ORAL

## 2019-09-17 MED ORDER — DIPHENHYDRAMINE HCL 50 MG/ML IJ SOLN: 12.5000 mg | INTRAMUSCULAR | Status: DC | PRN

## 2019-09-17 MED ORDER — ONDANSETRON HCL 4 MG/2ML IJ SOLN: 4.0000 mg | Freq: Three times a day (TID) | INTRAMUSCULAR | Status: DC | PRN

## 2019-09-17 MED ORDER — PHENYLEPHRINE HCL (PRESSORS) 10 MG/ML IV SOLN
INTRAVENOUS | Status: DC | PRN
Start: 2019-09-17 — End: 2019-09-17
  Administered 2019-09-17 (×4): 40 ug via INTRAVENOUS

## 2019-09-17 MED ORDER — KETOROLAC TROMETHAMINE 30 MG/ML IJ SOLN
INTRAMUSCULAR | Status: AC
  Filled 2019-09-17: qty 1

## 2019-09-17 SURGICAL SUPPLY — 34 items
BENZOIN TINCTURE PRP APPL 2/3 (GAUZE/BANDAGES/DRESSINGS) ×3 IMPLANT
CHLORAPREP W/TINT 26ML (MISCELLANEOUS) ×3 IMPLANT
CLAMP CORD UMBIL (MISCELLANEOUS) IMPLANT
CLOSURE WOUND 1/2 X4 (GAUZE/BANDAGES/DRESSINGS) ×1
CLOTH BEACON ORANGE TIMEOUT ST (SAFETY) ×3 IMPLANT
DERMABOND ADVANCED (GAUZE/BANDAGES/DRESSINGS)
DERMABOND ADVANCED .7 DNX12 (GAUZE/BANDAGES/DRESSINGS) IMPLANT
DRSG OPSITE POSTOP 4X10 (GAUZE/BANDAGES/DRESSINGS) ×3 IMPLANT
ELECT REM PT RETURN 9FT ADLT (ELECTROSURGICAL) ×3
ELECTRODE REM PT RTRN 9FT ADLT (ELECTROSURGICAL) ×1 IMPLANT
EXTRACTOR VACUUM M CUP 4 TUBE (SUCTIONS) ×2 IMPLANT
EXTRACTOR VACUUM M CUP 4' TUBE (SUCTIONS) ×1
GLOVE BIO SURGEON STRL SZ7.5 (GLOVE) ×3 IMPLANT
GLOVE BIOGEL PI IND STRL 7.0 (GLOVE) ×1 IMPLANT
GLOVE BIOGEL PI INDICATOR 7.0 (GLOVE) ×2
GOWN STRL REUS W/TWL LRG LVL3 (GOWN DISPOSABLE) ×6 IMPLANT
KIT ABG SYR 3ML LUER SLIP (SYRINGE) ×3 IMPLANT
NEEDLE HYPO 25X5/8 SAFETYGLIDE (NEEDLE) ×3 IMPLANT
NS IRRIG 1000ML POUR BTL (IV SOLUTION) ×3 IMPLANT
PACK C SECTION WH (CUSTOM PROCEDURE TRAY) ×3 IMPLANT
PAD OB MATERNITY 4.3X12.25 (PERSONAL CARE ITEMS) ×3 IMPLANT
PENCIL SMOKE EVAC W/HOLSTER (ELECTROSURGICAL) ×3 IMPLANT
STRIP CLOSURE SKIN 1/2X4 (GAUZE/BANDAGES/DRESSINGS) ×2 IMPLANT
SUT MNCRL 0 VIOLET CTX 36 (SUTURE) ×4 IMPLANT
SUT MONOCRYL 0 CTX 36 (SUTURE) ×8
SUT PDS AB 0 CTX 60 (SUTURE) ×3 IMPLANT
SUT PLAIN 0 NONE (SUTURE) IMPLANT
SUT PLAIN 2 0 (SUTURE)
SUT PLAIN 2 0 XLH (SUTURE) IMPLANT
SUT PLAIN ABS 2-0 CT1 27XMFL (SUTURE) IMPLANT
SUT VIC AB 4-0 KS 27 (SUTURE) ×3 IMPLANT
TOWEL OR 17X24 6PK STRL BLUE (TOWEL DISPOSABLE) ×3 IMPLANT
TRAY FOLEY W/BAG SLVR 14FR LF (SET/KITS/TRAYS/PACK) ×3 IMPLANT
WATER STERILE IRR 1000ML POUR (IV SOLUTION) ×3 IMPLANT

## 2019-09-17 NOTE — Brief Op Note (Signed)
09/17/2019  8:27 AM  PATIENT:  Marchelle Folks Balla  29 y.o. female  PRE-OPERATIVE DIAGNOSIS:  previous  POST-OPERATIVE DIAGNOSIS:  previous C/S; desires repeat  PROCEDURE:  Procedure(s) with comments: CESAREAN SECTION (N/A) - Repeat edc 09/24/19 allergy to azithromycin Herbert Seta,  RNFA  SURGEON:  Surgeon(s) and Role:    * Harold Hedge, MD - Primary  PHYSICIAN ASSISTANT:   ASSISTANTS: Webb Silversmith RNFA   ANESTHESIA:   spinal  EBL:  Per anesthesia note  BLOOD ADMINISTERED:none  DRAINS: Urinary Catheter (Foley)   LOCAL MEDICATIONS USED:  NONE  SPECIMEN:  Source of Specimen:  placenta  DISPOSITION OF SPECIMEN:  PATHOLOGY  COUNTS:  YES  TOURNIQUET:  * No tourniquets in log *  DICTATION: .Other Dictation: Dictation Number [id number]  PLAN OF CARE: Admit to inpatient   PATIENT DISPOSITION:  PACU - hemodynamically stable.   Delay start of Pharmacological VTE agent (>24hrs) due to surgical blood loss or risk of bleeding: not applicable

## 2019-09-17 NOTE — Op Note (Signed)
Savannah Cabrera, Savannah Cabrera MEDICAL RECORD [id number] ACCOUNT [id number] DATE OF BIRTH:[birth year] FACILITY: [facility] LOCATION: [facility] PHYSICIAN:Chandra Feger Jamal Collin II, MD  OPERATIVE REPORT  DATE OF PROCEDURE:  09/17/2019  PREOPERATIVE DIAGNOSES: 1.  Intrauterine pregnancy at 72 and 0/7 weeks. 2.  Previous cesarean section, desires repeat.  POSTOPERATIVE DIAGNOSES: 1.  Intrauterine pregnancy at 41 and 0/7 weeks. 2.  Previous cesarean section, desires repeat.  PROCEDURE:  Repeat low transverse cesarean section.  SURGEON:  Harold Hedge, MD   ANESTHESIA:  Spinal, Mal Amabile, MD  SPECIMENS:  Placenta to pathology.  FINDINGS:  Viable female infant.  Apgars, arterial cord pH, birth weight pending.  ESTIMATED BLOOD LOSS:  Per anesthesia record.  INDICATIONS AND CONSENT:  This patient is a 29 year old G4 P1 with a previous cesarean section.  A repeat is discussed.  Potential risks and complications were reviewed preoperatively, including but not limited to infection, organ damage, bleeding  requiring transfusion of blood products with HIV and hepatitis acquisition, DVT, PE, pneumonia, wound breakdown.  The patient states she understands and agrees and consent was signed on the chart.  DESCRIPTION OF PROCEDURE:  The patient was taken to the operating room where she was identified.  Spinal anesthetic was placed per Dr. Malen Gauze and she was placed in the dorsal supine position with a 15-degree left lateral wedge.  She was prepped  vaginally.  A Foley catheter was placed in the bladder and she was prepped abdominally, then with ChloraPrep.  After a 3 minute drying time, she was draped in sterile fashion.  Timeout was undertaken.  After testing for adequate spinal anesthesia, skin  was entered through the Pfannenstiel scar and dissection was carried out in layers to the peritoneum, which was taken down superiorly and inferiorly.  Vesicouterine peritoneum was taken down.  Uterus was incised in a low  transverse manner and the uterine  cavity was entered bluntly with a hemostat.  Clear fluid was noted.  The incision was extended with the fingers.  Vertex was then delivered with the aid of vacuum extractor to elevate the vertex through the incision.  The baby was then delivered without  difficulty.  Good cry and tone was noted and after 1 minute, the cord was clamped and cut.  Baby was handed to waiting pediatrics team.  Placenta was manually delivered and sent to pathology.  Cavity was carefully explored.  Cavity was clean.  There was  initially some uterine atony, which was controlled with massage and IV Pitocin.  The incision was closed with 2 running locking imbricating layers of 0 Monocryl suture, which achieved good hemostasis.  Lavage was carried out.  Anterior peritoneum was  closed in a running fashion with 0 Monocryl suture, which was also used to reapproximate the pyramidalis muscle in the midline.  Anterior rectus fascia was closed in a running fashion with a 0 looped PDS, starting at each angle and meeting in the middle.   Subcutaneous layer was closed with interrupted plain suture and the skin was closed in a subcuticular fashion with 4-0 Vicryl on a Keith needle.  Benzoin, Steri-Strips, honeycomb pressure dressings are applied.  All counts were correct.  The patient  was taken to the recovery room in stable condition.  VN/NUANCE  D:09/17/2019 T:09/17/2019 JOB:[id number]

## 2019-09-17 NOTE — Progress Notes (Signed)
Patient states she feels a lot better. No nausea at this time.

## 2019-09-17 NOTE — Anesthesia Procedure Notes (Signed)
Spinal  Patient location during procedure: OR Start time: 09/17/2019 7:26 AM End time: 09/17/2019 7:31 AM Staffing Performed: anesthesiologist  Anesthesiologist: Mal Amabile, MD Preanesthetic Checklist Completed: patient identified, IV checked, site marked, risks and benefits discussed, surgical consent, monitors and equipment checked, pre-op evaluation and timeout performed Spinal Block Patient position: sitting Prep: DuraPrep and site prepped and draped Patient monitoring: heart rate, cardiac monitor, continuous pulse ox and blood pressure Approach: midline Location: L3-4 Injection technique: single-shot Needle Needle type: Pencan  Needle gauge: 24 G Needle length: 9 cm Needle insertion depth: 7 cm Assessment Sensory level: T4 Additional Notes Patient tolerated procedure well. Adequate sensory level.

## 2019-09-17 NOTE — Progress Notes (Signed)
Patient and infant safety reviewed. Patient told not to get up .

## 2019-09-17 NOTE — Progress Notes (Signed)
Did orthostatics on the patient. Her Standing orthostatic was 87/50. Patient stated she got sweaty. I layed her back in bed and took a repeat Blood pressure and it was 95/61 Patient stated she felt a lot better.

## 2019-09-17 NOTE — Progress Notes (Signed)
Dr. Malen Gauze aware of low BP of 87/50, 500 cc LR bolus ordered.

## 2019-09-17 NOTE — Anesthesia Postprocedure Evaluation (Signed)
Anesthesia Post Note  Patient: Savannah Cabrera  Procedure(s) Performed: CESAREAN SECTION (N/A )     Patient location during evaluation: PACU Anesthesia Type: Spinal Level of consciousness: oriented and awake and alert Pain management: pain level controlled Vital Signs Assessment: post-procedure vital signs reviewed and stable Respiratory status: spontaneous breathing, respiratory function stable and nonlabored ventilation Cardiovascular status: blood pressure returned to baseline and stable Postop Assessment: no headache, no backache, no apparent nausea or vomiting, spinal receding and patient able to bend at knees Anesthetic complications: no   No complications documented.  Last Vitals:  Vitals:   09/17/19 0930 09/17/19 0943  BP: 112/69 114/72  Pulse: 100 (!) 113  Resp: (!) 23 20  Temp: 36.7 C 36.7 C  SpO2: 99% 100%    Last Pain:  Vitals:   09/17/19 0943  TempSrc:   PainSc: 1    Pain Goal:    LLE Motor Response: Purposeful movement (09/17/19 0930)   RLE Motor Response: Purposeful movement (09/17/19 0930)       Epidural/Spinal Function Cutaneous sensation: Able to Discern Pressure (09/17/19 0930), Patient able to flex knees: Yes (09/17/19 0930), Patient able to lift hips off bed: No (09/17/19 0930), Back pain beyond tenderness at insertion site: No (09/17/19 0930), Progressively worsening motor and/or sensory loss: No (09/17/19 0930), Bowel and/or bladder incontinence post epidural: No (09/17/19 0930)  [name] A.

## 2019-09-17 NOTE — Progress Notes (Signed)
Patient has been nauseated since coming to the floor around 1000am she just did not want me to get any further medications until now. Dr. Henderson Cloud called and notified of nausea Phenergan 12.5mg  ordered but it was verified by Dr. Malen Gauze and he stated to give just 6.25IV x1 of phenergan. Dr. Malen Gauze and Dr. Henderson Cloud notified of a IV dilaudid order that was active. Dr. Malen Gauze does not want any narcotics given per orders until after the 12 post duramorph orders. See orders. I discontinued the dilaudid order.

## 2019-09-17 NOTE — Transfer of Care (Signed)
Immediate Anesthesia Transfer of Care Note  Patient: Savannah Cabrera  Procedure(s) Performed: CESAREAN SECTION (N/A )  Patient Location: PACU  Anesthesia Type:Spinal  Level of Consciousness: awake, alert  and oriented  Airway & Oxygen Therapy: Patient Spontanous Breathing  Post-op Assessment: Report given to RN and Post -op Vital signs reviewed and stable  Post vital signs: Reviewed and stable  Last Vitals:  Vitals Value Taken Time  BP 160/141 09/17/19 0842  Temp    Pulse 103 09/17/19 0844  Resp 22 09/17/19 0844  SpO2 98 % 09/17/19 0844  Vitals shown include unvalidated device data.  Last Pain:  Vitals:   09/17/19 0634  TempSrc: Oral         Complications: No complications documented.

## 2019-09-17 NOTE — Progress Notes (Addendum)
Patient is sleeping . Breathing WNL. Patient stated her legs are not tingly anymore around 1300. She stated she felt them well.

## 2019-09-17 NOTE — H&P (Signed)
Savannah Cabrera is a 29 y.o. female presenting for repeat cesarean section. Pregnancy complicated by A1GDM. U/S in office @ 37 5/7 noted EFW of 7# 3oz. Also Hx of anxiety no meds currently. Question of hypertension. Hx of LGA and cesarean section. She desires repeat. OB History    Gravida  4   Para  1   Term  0   Preterm  0   AB  1   Living  0     SAB  1   TAB  0   Ectopic  0   Multiple  0   Live Births  0          Past Medical History:  Diagnosis Date  . Anxiety   . Hypertension    Past Surgical History:  Procedure Laterality Date  . CESAREAN SECTION N/A 03/19/2017   Procedure: CESAREAN SECTION;  Surgeon: Ranae Pila, MD;  Location: [facility] BIRTHING SUITES;  Service: Obstetrics;  Laterality: N/A;  . TONSILLECTOMY     Family History: family history includes Diabetes in her paternal grandfather and paternal grandmother; Heart disease in her paternal grandfather; Hypertension in her father and paternal grandmother; Thyroid disease in her mother and sister. Social History:  reports that she has never smoked. She has never used smokeless tobacco. She reports that she does not drink alcohol and does not use drugs.     Maternal Diabetes: Yes:  Diabetes Type:  Diet controlled Genetic Screening: Normal Maternal Ultrasounds/Referrals: Normal Fetal Ultrasounds or other Referrals:  None Maternal Substance Abuse:  No Significant Maternal Medications:  None Significant Maternal Lab Results:  Group B Strep negative Other Comments:  None  Review of Systems  Constitutional: Negative for fever.  Eyes: Negative for visual disturbance.  Gastrointestinal: Negative for abdominal pain.  Neurological: Negative for headaches.   Maternal Medical History:  Fetal activity: Perceived fetal activity is normal.        Blood pressure 132/85, pulse 98, temperature 98.1 F (36.7 C), temperature source Oral, resp. rate 18, height 5\' 8"  (1.727 m), weight 134.7 kg, SpO2 100 %,  currently breastfeeding. Maternal Exam:  Abdomen: Fetal presentation: vertex     Physical Exam Cardiovascular:     Rate and Rhythm: Normal rate.  Pulmonary:     Effort: Pulmonary effort is normal.  Abdominal:     Tenderness: There is no abdominal tenderness.     Prenatal labs: ABO, Rh: --/--/O NEG, O NEG Performed at [facility], [address], [place], [place]  [phone]) Antibody: NEG (06/30 0841) Rubella: Immune (12/02 0000) RPR: NON REACTIVE (06/30 0847)  HBsAg: Negative (12/02 0000)  HIV: Non-reactive (12/02 0000)  GBS:   negative 08/25/19  Assessment/Plan: 29 yo G4P1 for repeat cesarean section D/W risks including infection, organ damage, bleeding/transfusion-HIV/Hep, DVT/PE, pneumonia, wound breakdown. She states she understands and agrees.   34 II 09/17/2019, 7:06 AM

## 2019-09-18 ENCOUNTER — Encounter (HOSPITAL_COMMUNITY): Payer: Self-pay | Admitting: Obstetrics and Gynecology

## 2019-09-18 LAB — CBC
HCT: 27.9 % — ABNORMAL LOW (ref 36.0–46.0)
Hemoglobin: 8.9 g/dL — ABNORMAL LOW (ref 12.0–15.0)
MCH: 29.1 pg (ref 26.0–34.0)
MCHC: 31.9 g/dL (ref 30.0–36.0)
MCV: 91.2 fL (ref 80.0–100.0)
Platelets: 215 10*3/uL (ref 150–400)
RBC: 3.06 MIL/uL — ABNORMAL LOW (ref 3.87–5.11)
RDW: 14 % (ref 11.5–15.5)
WBC: 12.9 10*3/uL — ABNORMAL HIGH (ref 4.0–10.5)
nRBC: 0 % (ref 0.0–0.2)

## 2019-09-18 LAB — GLUCOSE, CAPILLARY: Glucose-Capillary: 110 mg/dL — ABNORMAL HIGH (ref 70–99)

## 2019-09-18 MED ORDER — IBUPROFEN 600 MG PO TABS
600.0000 mg | ORAL_TABLET | Freq: Four times a day (QID) | ORAL | Status: DC | PRN
  Administered 2019-09-19: 600 mg via ORAL

## 2019-09-18 MED ORDER — RHO D IMMUNE GLOBULIN 1500 UNIT/2ML IJ SOSY
300.0000 ug | PREFILLED_SYRINGE | Freq: Once | INTRAMUSCULAR | Status: AC
  Administered 2019-09-18: 300 ug via INTRAVENOUS
  Filled 2019-09-18: qty 2

## 2019-09-18 NOTE — Lactation Note (Signed)
This note was copied from a baby's chart. Lactation Consultation Note Attempted to see mom, mom in BR. FOB holding baby. Will f/u later.  Patient Name: Savannah Cabrera TDHRC'B Date: 09/18/2019 Reason for consult: Mother's request;Difficult latch   Maternal Data    Feeding Feeding Type: Breast Fed  LATCH Score Latch: Grasps breast easily, tongue down, lips flanged, rhythmical sucking.  Audible Swallowing: A few with stimulation  Type of Nipple: Everted at rest and after stimulation (2)  Comfort (Breast/Nipple): Soft / non-tender  Hold (Positioning): Assistance needed to correctly position infant at breast and maintain latch.  LATCH Score: 8  Interventions Interventions: Breast feeding basics reviewed;Assisted with latch;Breast massage;Hand express;Pre-pump if needed  Lactation Tools Discussed/Used Tools: Nipple Shields Nipple shield size: 24   Consult Status Consult Status: Follow-up Date: 09/19/19 Follow-up type: In-patient    Charyl Dancer 09/18/2019, 8:13 PM

## 2019-09-18 NOTE — Progress Notes (Signed)
POD # 1  Doing well Results for orders placed or performed during the hospital encounter of 09/17/19 (from the past 24 hour(s))  Comprehensive metabolic panel     Status: Abnormal   Collection Time: 09/17/19 11:00 AM  Result Value Ref Range   Sodium 138 135 - 145 mmol/L   Potassium 3.7 3.5 - 5.1 mmol/L   Chloride 108 98 - 111 mmol/L   CO2 20 (L) 22 - 32 mmol/L   Glucose, Bld 100 (H) 70 - 99 mg/dL   BUN 7 6 - 20 mg/dL   Creatinine, Ser 9.67 0.44 - 1.00 mg/dL   Calcium 8.1 (L) 8.9 - 10.3 mg/dL   Total Protein 5.3 (L) 6.5 - 8.1 g/dL   Albumin 2.5 (L) 3.5 - 5.0 g/dL   AST 17 15 - 41 U/L   ALT 9 0 - 44 U/L   Alkaline Phosphatase 87 38 - 126 U/L   Total Bilirubin 0.5 0.3 - 1.2 mg/dL   GFR calc non Af Amer >60 >60 mL/min   GFR calc Af Amer >60 >60 mL/min   Anion gap 10 5 - 15  CBC     Status: Abnormal   Collection Time: 09/18/19  4:17 AM  Result Value Ref Range   WBC 12.9 (H) 4.0 - 10.5 K/uL   RBC 3.06 (L) 3.87 - 5.11 MIL/uL   Hemoglobin 8.9 (L) 12.0 - 15.0 g/dL   HCT 89.3 (L) 36 - 46 %   MCV 91.2 80.0 - 100.0 fL   MCH 29.1 26.0 - 34.0 pg   MCHC 31.9 30.0 - 36.0 g/dL   RDW 81.0 17.5 - 10.2 %   Platelets 215 150 - 400 K/uL   nRBC 0.0 0.0 - 0.2 %  Glucose, capillary     Status: Abnormal   Collection Time: 09/18/19  5:47 AM  Result Value Ref Range   Glucose-Capillary 110 (H) 70 - 99 mg/dL   Abdomen is soft and non tender  Bandage clean and dry and intact   POD # 1  Doing well Routine care Discharge home tomorrow

## 2019-09-18 NOTE — Lactation Note (Signed)
This note was copied from a baby's chart. Lactation Consultation Note  Patient Name: Savannah Cabrera LKHVF'M Date: 09/18/2019   Lactation consult in now.  Mom sleeping.   Maternal Data    Feeding    LATCH Score                   Interventions    Lactation Tools Discussed/Used     Consult Status      [name] 09/18/2019, 12:23 PM

## 2019-09-18 NOTE — Progress Notes (Signed)
CSW received consult for hx of Anxiety.  CSW met with MOB to offer support and complete assessment. On arrival, CSW introduced self and offer purpose for visit. FOB and infant Elenor Quinones were present, however, after PPD/A and SIDS education, FOB stepped out of room to offer MOB privacy during assessment. MOB and FOB were pleasant and engaged during visit.   CSW provided education regarding the baby blues period vs. perinatal mood disorders, discussed treatment and gave resources for mental health follow up if concerns arise.  CSW recommends self-evaluation during the postpartum time period using the New Mom Checklist from Postpartum Progress and encouraged MOB and FOB to contact a medical professional if symptoms are noted at any time. MOB and FOB agreed and denied any additional questions. MOB reported 2 month period of postpartum depression six months after the birth of first child. MOB reported sx as crying, anxiousness, and desire to just get away. MOB stated after talking to sister she decided to pursue conuseling to address sx. MOB reported she returned to counseling with Melissa at [place] as she was helpful  Miscarriage support in the past. MOB reported also being treated with Zoloft during that time. MOB later discontinued Rx. MOB stated strategies such as deep breathing exercises, open communication with partner, and reminding herself to show herself grace have been helpful when managing sx.    CSW provided review of Sudden Infant Death Syndrome (SIDS) precautions. MOB and FOB stated understanding and denied any additional questions. MOB confirmed having all needed items for baby including car seat and bassinet for baby's safe sleep.   During assessment, MOB reported history of generalized anxiety and depression. MOB reported sx such as sadness, crying, and  panic attacks started in college. MOB reported participation in counseling since college. MOB reported as needed Rx Zanax  has helped prevent panic attacks sx. Also, MOB stated Zoloft has been helpful at times. MOB reports no active Rx at this time. MOB stated plan to reconnect with counselor Lenna Sciara if PPD/A sx return. MOB denied any SI, HI, or domestic violence. MOB identified FOB, 2 sisters, best friend, and cousin as support.     CSW identifies no further need for intervention and no barriers to discharge at this time.  Taneia Mealor D. Lissa Morales, MSW, [facility] Clinical Social Worker [phone]

## 2019-09-18 NOTE — Lactation Note (Signed)
This note was copied from a baby's chart. Lactation Consultation Note  Patient Name: Girl Naelle Diegel OACZY'S Date: 09/18/2019 Reason for consult: Mother's request;Difficult latch Mom requests to see lactation.  Mom reports she breastfed her first baby with no issues but this baby is not latching well.   Assisted at the breast and infant wants to tongue thrust.  Attempt several positions and infant does same thing. Applied 24 mm nipple shield.  Infant latched and breastfed better on right breast in football hold.  Mom reports this is the longest she has breastfed. Infant somewhat choppy at the breast but no longer tongue thrusting.  Left mom and baby breastfeeding,.  Urged mom to call lactation as needed. Discussed with Jami getting mom pumping.  Mom ale to hand express large amounts of colostrum at this time. Urged to call lactation as needed  Maternal Data    Feeding Feeding Type: Breast Fed  LATCH Score Latch: Grasps breast easily, tongue down, lips flanged, rhythmical sucking.  Audible Swallowing: A few with stimulation  Type of Nipple: Everted at rest and after stimulation (2)  Comfort (Breast/Nipple): Soft / non-tender  Hold (Positioning): Assistance needed to correctly position infant at breast and maintain latch.  LATCH Score: 8  Interventions Interventions: Breast feeding basics reviewed;Assisted with latch;Breast massage;Hand express;Pre-pump if needed  Lactation Tools Discussed/Used Tools: Nipple Shields Nipple shield size: 24   Consult Status Consult Status: Follow-up Date: 09/19/19 Follow-up type: In-patient    [facility] Michaelle Copas 09/18/2019, 7:16 PM

## 2019-09-19 ENCOUNTER — Encounter (HOSPITAL_COMMUNITY): Payer: Self-pay | Admitting: Obstetrics and Gynecology

## 2019-09-19 LAB — RH IG WORKUP (INCLUDES ABO/RH)
ABO/RH(D): O NEG
Fetal Screen: NEGATIVE
Gestational Age(Wks): 39
Unit division: 0

## 2019-09-19 MED ORDER — IBUPROFEN 600 MG PO TABS: 600.0000 mg | ORAL_TABLET | Freq: Four times a day (QID) | ORAL | 0 refills | Status: AC | PRN

## 2019-09-19 MED ORDER — OXYCODONE HCL 5 MG PO TABS: 5.0000 mg | ORAL_TABLET | ORAL | 0 refills | Status: AC | PRN

## 2019-09-19 NOTE — Plan of Care (Signed)
All discharge teaching given and patient receptive  

## 2019-09-19 NOTE — Discharge Summary (Signed)
Postpartum Discharge Summary  Date of Service updated September 19, 2019     Patient Name: Savannah Cabrera DOB: January 17, 1991 MRN: [id number]  Date of admission: 09/17/2019 Delivery date:09/17/2019  Delivering provider: Everlene Farrier  Date of discharge: 09/19/2019  Admitting diagnosis: History of cesarean section [Z98.891] Intrauterine pregnancy: [redacted]w[redacted]d    Secondary diagnosis:  Active Problems:   History of cesarean section  Additional problems: none    Discharge diagnosis: Term Pregnancy Delivered                                              Post partum procedures:none Augmentation: none Complications: None  Hospital course: Sceduled C/S   29y.o. yo [id number] admitted to the hospital 09/17/2019 for scheduled cesarean section with the following indication:Elective Repeat.Delivery details are as follows:  Membrane Rupture Time/Date: 7:56 AM ,09/17/2019   Delivery Method:C-Section, Vacuum Assisted  Details of operation can be found in separate operative note.  Patient had an uncomplicated postpartum course.  She is ambulating, tolerating a regular diet, passing flatus, and urinating well. Patient is discharged home in stable condition on  09/19/19        Newborn Data: Birth date:09/17/2019  Birth time:7:57 AM  Gender:Female  Living status:Living  Apgars:9 ,9  Weight:3915 g     Magnesium Sulfate received: No BMZ received: No Rhophylac:No MMR:No T-DaP:Given prenatally Flu: Yes Transfusion:No  Physical exam  Vitals:   09/18/19 0810 09/18/19 2110 09/19/19 0519 09/19/19 0538  BP: 109/66 125/68 122/66   Pulse: 85 87 78   Resp: _0 Temp: 97.9 F (36.6 C) 98.7 F (37.1 C)  98 F (36.7 C)  TempSrc: Oral Oral  Oral  SpO2: 99% 99% 100%   Weight:      Height:       General: alert, cooperative and no distress Lochia: appropriate Uterine Fundus: firm Incision: Healing well with no significant drainage DVT Evaluation: No evidence of DVT seen on physical  exam. Labs: Lab Results  Component Value Date   WBC 12.9 (H) 09/18/2019   HGB 8.9 (L) 09/18/2019   HCT 27.9 (L) 09/18/2019   MCV 91.2 09/18/2019   PLT 215 09/18/2019   CMP Latest Ref Rng & Units 09/17/2019  Glucose 70 - 99 mg/dL 100(H)  BUN 6 - 20 mg/dL 7  Creatinine 0.44 - 1.00 mg/dL 0.55  Sodium 135 - 145 mmol/L 138  Potassium 3.5 - 5.1 mmol/L 3.7  Chloride 98 - 111 mmol/L 108  CO2 22 - 32 mmol/L 20(L)  Calcium 8.9 - 10.3 mg/dL 8.1(L)  Total Protein 6.5 - 8.1 g/dL 5.3(L)  Total Bilirubin 0.3 - 1.2 mg/dL 0.5  Alkaline Phos 38 - 126 U/L 87  AST 15 - 41 U/L 17  ALT 0 - 44 U/L 9   Edinburgh Score: Edinburgh Postnatal Depression Scale Screening Tool 09/17/2019  I have been able to laugh and see the funny side of things. 1  I have looked forward with enjoyment to things. 0  I have blamed myself unnecessarily when things went wrong. 1  I have been anxious or worried for no good reason. 2  I have felt scared or panicky for no good reason. 2  Things have been getting on top of me. 0  I have been so unhappy that I have had difficulty sleeping. 0  I  have felt sad or miserable. 1  I have been so unhappy that I have been crying. 1  The thought of harming myself has occurred to me. 0  Edinburgh Postnatal Depression Scale Total 8      After visit meds:  Allergies as of 09/19/2019      Reactions   Azithromycin Hives, Diarrhea, Nausea And Vomiting   Cinnamon Hives   Itchy throat, headache      Medication List    TAKE these medications   acetaminophen 500 MG tablet Commonly known as: TYLENOL Take 1,000 mg by mouth every 6 (six) hours as needed for moderate pain or headache.   calcium carbonate 500 MG chewable tablet Commonly known as: TUMS - dosed in mg elemental calcium Chew 1,500 mg by mouth daily as needed for indigestion or heartburn.   cetirizine 10 MG tablet Commonly known as: ZYRTEC Take 10 mg by mouth daily as needed for allergies.   ibuprofen 600 MG  tablet Commonly known as: ADVIL Take 1 tablet (600 mg total) by mouth every 6 (six) hours as needed for mild pain or moderate pain.   oxyCODONE 5 MG immediate release tablet Commonly known as: Oxy IR/ROXICODONE Take 1-2 tablets (5-10 mg total) by mouth every 4 (four) hours as needed for moderate pain.   prenatal multivitamin Tabs tablet Take 1 tablet by mouth daily.        Discharge home in stable condition Infant Feeding: Breast Infant Disposition:home with mother Discharge instruction: per After Visit Summary and Postpartum booklet. Activity: Advance as tolerated. Pelvic rest for 6 weeks.  Diet: routine diet Anticipated Birth Control: Unsure Postpartum Appointment:1 week Additional Postpartum F/U: Incision check 1 week Future Appointments:No future appointments. Follow up Visit:      09/19/2019 Cyril Mourning, MD

## 2019-09-19 NOTE — Lactation Note (Signed)
This note was copied from a baby's chart. Lactation Consultation Note  Patient Name: Savannah Cabrera PNTIR'W Date: 09/19/2019 Reason for consult: Follow-up assessment;Infant weight loss;Difficult latch Baby is 48 hours old/9% weight loss.  Mom states last two feedings at breast went well.  She is using a 24 mm nipple shield and shield is full of milk when baby comes off.  Mom states she had a very good milk supply with first baby.  She feels breasts are becoming fuller.  Discussed weight loss.  Mom states she was planning on pumping and bottle feeding at home until weight improved.  Recommended putting baby to breast first and then post pumping and supplementing expressed milk.  Symphony pump set up and initiated.  Mom pumped for 15 minutes on initiation setting.  She obtained 16 mls of transitional milk.  Instructed to bottle feed expressed milk to baby.  Mom will continue to put baby to breast with cues using a 24 mm nipple shield, post pump x 15 minutes and supplement with expressed breast milk.  She has a Medela DEBP at home.  Encouraged to call for assist prn.  Maternal Data    Feeding    LATCH Score                   Interventions    Lactation Tools Discussed/Used Pump Review: Setup, frequency, and cleaning;Milk Storage Initiated by:: Lmoulden Date initiated:: 09/19/19   Consult Status Consult Status: Follow-up Date: 09/20/19 Follow-up type: In-patient    Huston Foley 09/19/2019, 8:45 AM

## 2019-09-21 LAB — SURGICAL PATHOLOGY
